# Patient Record
Sex: Female | Born: 1960 | Race: White | Hispanic: No | Marital: Married | Smoking: Never smoker
Health system: Southern US, Community
[De-identification: ages and names within clinical notes are randomized; demographics above are authoritative.]

## PROBLEM LIST (undated history)

## (undated) DIAGNOSIS — I10 Essential (primary) hypertension: Secondary | ICD-10-CM

## (undated) DIAGNOSIS — E7800 Pure hypercholesterolemia, unspecified: Secondary | ICD-10-CM

## (undated) DIAGNOSIS — IMO0002 Reserved for concepts with insufficient information to code with codable children: Secondary | ICD-10-CM

## (undated) DIAGNOSIS — R87629 Unspecified abnormal cytological findings in specimens from vagina: Secondary | ICD-10-CM

## (undated) DIAGNOSIS — E079 Disorder of thyroid, unspecified: Secondary | ICD-10-CM

## (undated) DIAGNOSIS — E039 Hypothyroidism, unspecified: Secondary | ICD-10-CM

## (undated) DIAGNOSIS — R232 Flushing: Secondary | ICD-10-CM

## (undated) DIAGNOSIS — Z8719 Personal history of other diseases of the digestive system: Secondary | ICD-10-CM

## (undated) DIAGNOSIS — K219 Gastro-esophageal reflux disease without esophagitis: Secondary | ICD-10-CM

## (undated) DIAGNOSIS — R87619 Unspecified abnormal cytological findings in specimens from cervix uteri: Secondary | ICD-10-CM

## (undated) HISTORY — DX: Unspecified abnormal cytological findings in specimens from cervix uteri: R87.619

## (undated) HISTORY — DX: Reserved for concepts with insufficient information to code with codable children: IMO0002

## (undated) HISTORY — DX: Unspecified abnormal cytological findings in specimens from vagina: R87.629

## (undated) HISTORY — PX: CHOLECYSTECTOMY: SHX55

## (undated) HISTORY — PX: TONSILLECTOMY: SUR1361

## (undated) HISTORY — PX: TUBAL LIGATION: SHX77

## (undated) HISTORY — DX: Flushing: R23.2

## (undated) HISTORY — PX: ENDOMETRIAL ABLATION: SHX621

## (undated) HISTORY — PX: FOOT BONE EXCISION: SUR493

---

## 2002-06-03 ENCOUNTER — Ambulatory Visit (HOSPITAL_BASED_OUTPATIENT_CLINIC_OR_DEPARTMENT_OTHER): Admission: RE | Admit: 2002-06-03 | Discharge: 2002-06-03 | Payer: Self-pay | Admitting: Obstetrics and Gynecology

## 2003-02-02 ENCOUNTER — Ambulatory Visit (HOSPITAL_COMMUNITY): Admission: RE | Admit: 2003-02-02 | Discharge: 2003-02-02 | Payer: Self-pay | Admitting: Internal Medicine

## 2003-02-02 ENCOUNTER — Encounter: Payer: Self-pay | Admitting: Internal Medicine

## 2004-04-17 ENCOUNTER — Ambulatory Visit (HOSPITAL_COMMUNITY): Admission: RE | Admit: 2004-04-17 | Discharge: 2004-04-17 | Payer: Self-pay | Admitting: Obstetrics and Gynecology

## 2004-10-04 ENCOUNTER — Emergency Department (HOSPITAL_COMMUNITY): Admission: EM | Admit: 2004-10-04 | Discharge: 2004-10-05 | Payer: Self-pay | Admitting: Emergency Medicine

## 2004-10-08 ENCOUNTER — Ambulatory Visit (HOSPITAL_COMMUNITY): Admission: RE | Admit: 2004-10-08 | Discharge: 2004-10-08 | Payer: Self-pay | Admitting: Internal Medicine

## 2005-04-11 ENCOUNTER — Emergency Department (HOSPITAL_COMMUNITY): Admission: EM | Admit: 2005-04-11 | Discharge: 2005-04-11 | Payer: Self-pay | Admitting: Emergency Medicine

## 2005-04-23 ENCOUNTER — Ambulatory Visit (HOSPITAL_COMMUNITY): Admission: RE | Admit: 2005-04-23 | Discharge: 2005-04-23 | Payer: Self-pay | Admitting: Internal Medicine

## 2007-01-29 ENCOUNTER — Ambulatory Visit (HOSPITAL_COMMUNITY): Admission: RE | Admit: 2007-01-29 | Discharge: 2007-01-29 | Payer: Self-pay | Admitting: Obstetrics and Gynecology

## 2007-01-29 ENCOUNTER — Encounter (INDEPENDENT_AMBULATORY_CARE_PROVIDER_SITE_OTHER): Payer: Self-pay | Admitting: *Deleted

## 2009-01-10 ENCOUNTER — Other Ambulatory Visit: Admission: RE | Admit: 2009-01-10 | Discharge: 2009-01-10 | Payer: Self-pay | Admitting: Obstetrics & Gynecology

## 2009-02-22 ENCOUNTER — Emergency Department (HOSPITAL_COMMUNITY): Admission: EM | Admit: 2009-02-22 | Discharge: 2009-02-22 | Payer: Self-pay | Admitting: Emergency Medicine

## 2009-12-13 ENCOUNTER — Ambulatory Visit (HOSPITAL_COMMUNITY): Admission: RE | Admit: 2009-12-13 | Discharge: 2009-12-13 | Payer: Self-pay | Admitting: Obstetrics and Gynecology

## 2009-12-21 ENCOUNTER — Encounter: Admission: RE | Admit: 2009-12-21 | Discharge: 2009-12-21 | Payer: Self-pay | Admitting: Obstetrics and Gynecology

## 2010-11-10 IMAGING — MG MM DIGITAL DIAGNOSTIC UNILAT L
1 series · 1 of 1 positions shown · non-contrast
Comparison: April 17, 2004

CLINICAL DATA: Lump described in the upper left breast at the time
of recent screening mammogram

DIGITAL DIAGNOSTIC LEFT MAMMOGRAM

[L TAN]
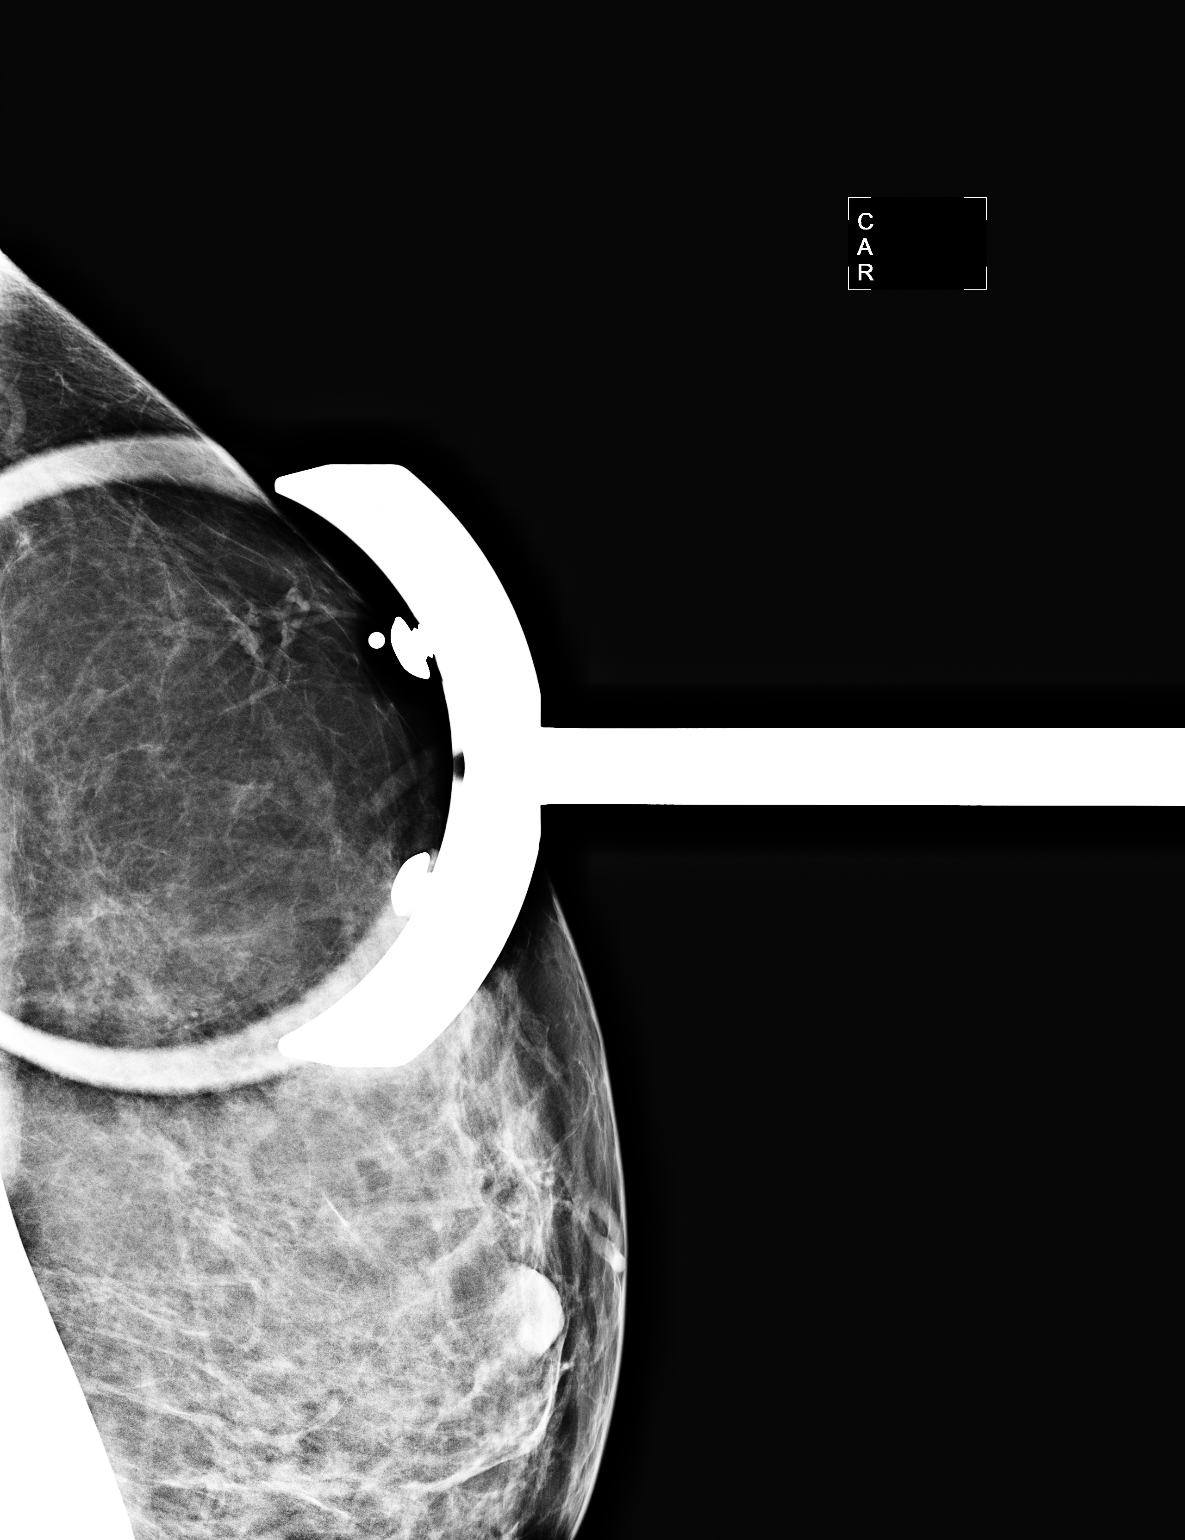

[1 of 1 positions shown; findings below may reference images not displayed]

FINDINGS: Additional view reveals no mass or distortion within the
upper left breast.  Fatty tissue only is seen in this location.
The patient can no longer feel a lump.  Physical exam of the left
breast is normal today.
IMPRESSION: There is no radiographic evidence of malignancy on the left.
Screening mammogram in 1 year recommended.

BI-RADS CATEGORY 1:  Negative.

## 2011-01-06 ENCOUNTER — Encounter: Payer: Self-pay | Admitting: Internal Medicine

## 2011-01-06 ENCOUNTER — Encounter: Payer: Self-pay | Admitting: Obstetrics and Gynecology

## 2011-05-03 NOTE — Op Note (Signed)
Erica Fry, Erica Fry               ACCOUNT NO.:  [id number]   MEDICAL RECORD NO.:  [id number]          PATIENT TYPE:  AMB   LOCATION:  DAY                           FACILITY:  [facility]   PHYSICIAN:  Tilda Burrow, M.D. DATE OF BIRTH:  [birth year]   DATE OF PROCEDURE:  01/29/2007  DATE OF DISCHARGE:                               OPERATIVE REPORT   PREOPERATIVE DIAGNOSIS:  Menorrhagia.   POSTOPERATIVE DIAGNOSIS:  Menorrhagia.   OPERATION PERFORMED:  Hysteroscopy, dilation and curettage, endometrial  ablation.   SURGEON:  Tilda Burrow, M.D.   ASSISTANTAmie Critchley, CST.   ANESTHESIA:  General.   COMPLICATIONS:  None.   FINDINGS:  Uterus sounding to 9 cm, smooth uterine internal contours,  retroverted midplane uterus.   DESCRIPTION OF PROCEDURE:  The patient was taken to the operating room,  prepped and draped for vaginal procedure.  Speculum was inserted.  Single toothed tenaculum grasped and paracervical block with 15 mL of  Marcaine with epinephrine injected.  Uterus was sounded to 9 cm and  dilated to 26 [place] allowing introduction of the 30 hysteroscope which  visualized endometrial cavity and photos 1, 2, and 3 show the  preprocedure endometrial cavity.  Dilation and curettage was then  performed and a subsequent hysteroscopic picture taken to document  excellent thorough evacuation of the debris from the endometrial cavity.  GyneCare Thermachoice endometrial ablation sequence was then performed  using 15 mL of saline under 158 mmHg pressure for 8 minutes resulting in  satisfactory thermal changes of the endometrial cavity as documented in  photo 5.  Photo 6 shows the lower uterine segment which shows some areas  that were not coagulated by the ablation process, there may be a  possibility of some residual hormonally active tissue from this lower  uterine segment.  If patient has residual menses after the procedure  rather than complete amenorrhea, this is considered  the likely source.   There were no complications.  All fluid was accounted for at the end of  the procedure.  The patient was then transferred to the recovery room in  good condition.   ADDENDUM:  During discussion with husband after the procedure, he spoke  very caringly about challenges in the relationship with the wife's  detachment, discontinuation of wearing the wedding ring, allegedly.  They have been  married 27 years.  She has within the last year stopped wearing the  wedding ring, has discussed whether to stay in the family home, etc.  Husband is very concerned and asked for guidance.  Will speak to  [facility]  in follow up to try to seek answers or suggestions.      Tilda Burrow, M.D.  Electronically Signed     JVF/MEDQ  D:  01/29/2007  T:  01/29/2007  Job:  [id number]   cc:   Kingsley Callander. Ouida Sills, MD  Fax: [phone]
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
## 2011-05-03 NOTE — Op Note (Signed)
[facility]  Patient:    Erica Fry, Erica Fry Visit Number: [id number] MRN: [id number]          Service Type: NES Location: NESC Attending Physician:  Lendon Colonel Dictated by:   Kathie Rhodes. Kyra Manges, M.D. Proc. Date: 06/03/02 Admit Date:  06/03/2002                             Operative Report  PREOPERATIVE DIAGNOSIS:  Desires sterilization.  POSTOPERATIVE DIAGNOSIS:  Desires sterilization.  OPERATION:  Laparoscopic tubal ligation.  DESCRIPTION OF PROCEDURE:  The patient was placed in the lithotomy position, prepped and draped in the usual fashion, bladder emptied.  The patient was examined and found to have an anterior normal size uterus without masses. Hulka elevator was inserted into the uterus.  A transverse incision was made in the abdomen.  The Veress needle was inserted.  Aspiration infusion was used, and the abdomen was distended with carbon dioxide.  A trocar was inserted into the abdomen.  Visualization of the pelvis revealed two normal tubes and ovaries.  A second puncture was made, and then the tubes were then cauterized 2 cm lateral to the ureterotubal junction and scissored.  Gas was evacuated.  The umbilical incision was closed with a UR-6 needle, approximating the fascia with 0 Vicryl and then 4-0 PDS for the skin.  The patient was awakened and carried to the recovery room in good condition.  The incision was infiltrated with 0.5% Marcaine with epinephrine. Dictated by:   S. Kyra Manges, M.D. Attending Physician:  Lendon Colonel DD:  06/03/02 TD:  06/04/02 Job: [id number]

## 2011-05-03 NOTE — Procedures (Signed)
Erica Fry, Erica Fry               ACCOUNT NO.:  [id number]   MEDICAL RECORD NO.:  [id number]          PATIENT TYPE:  REC   LOCATION:  RAD                           FACILITY:  [facility]   PHYSICIAN:  Kingsley Callander. Ouida Sills, MD       DATE OF BIRTH:  [birth year]   DATE OF PROCEDURE:  04/23/2005  DATE OF DISCHARGE:                                    STRESS TEST   The patient exercised 10 minutes (1 minute into stage 4 of the Bruce  protocol), obtaining a maximum heart rate of 174 (98% of the age-predicted  maximum heart rate) at a workload of 12.8 METS and discontinued exercise due  to leg fatigue. There were no symptoms of chest pain. There were no  arrhythmias. There were no ST segment changes diagnostic of ischemia. The  baseline EKG revealed normal sinus rhythm at 85 beats per minute with poor R-  wave progression.   IMPRESSION:  No evidence of exercise-induced ischemia.      ROF/MEDQ  D:  04/23/2005  T:  04/23/2005  Job:  [id number]

## 2012-01-29 ENCOUNTER — Other Ambulatory Visit: Payer: Self-pay

## 2012-01-29 ENCOUNTER — Emergency Department (HOSPITAL_COMMUNITY)
Admission: EM | Admit: 2012-01-29 | Discharge: 2012-01-29 | Disposition: A | Discharge status: Home / Self Care | Payer: BC Managed Care – PPO | Attending: Emergency Medicine | Admitting: Emergency Medicine

## 2012-01-29 ENCOUNTER — Emergency Department (HOSPITAL_COMMUNITY): Payer: BC Managed Care – PPO

## 2012-01-29 ENCOUNTER — Encounter (HOSPITAL_COMMUNITY): Payer: Self-pay | Admitting: Emergency Medicine

## 2012-01-29 DIAGNOSIS — R5381 Other malaise: Secondary | ICD-10-CM | POA: Insufficient documentation

## 2012-01-29 DIAGNOSIS — R209 Unspecified disturbances of skin sensation: Secondary | ICD-10-CM | POA: Insufficient documentation

## 2012-01-29 DIAGNOSIS — E079 Disorder of thyroid, unspecified: Secondary | ICD-10-CM | POA: Insufficient documentation

## 2012-01-29 DIAGNOSIS — R202 Paresthesia of skin: Secondary | ICD-10-CM

## 2012-01-29 DIAGNOSIS — R5383 Other fatigue: Secondary | ICD-10-CM | POA: Insufficient documentation

## 2012-01-29 DIAGNOSIS — R42 Dizziness and giddiness: Secondary | ICD-10-CM | POA: Insufficient documentation

## 2012-01-29 DIAGNOSIS — R079 Chest pain, unspecified: Secondary | ICD-10-CM | POA: Insufficient documentation

## 2012-01-29 DIAGNOSIS — I10 Essential (primary) hypertension: Secondary | ICD-10-CM | POA: Insufficient documentation

## 2012-01-29 HISTORY — DX: Disorder of thyroid, unspecified: E07.9

## 2012-01-29 HISTORY — DX: Essential (primary) hypertension: I10

## 2012-01-29 LAB — BASIC METABOLIC PANEL
BUN: 9 mg/dL (ref 6–23)
CO2: 28 mEq/L (ref 19–32)
Calcium: 10 mg/dL (ref 8.4–10.5)
Chloride: 107 mEq/L (ref 96–112)
Creatinine, Ser: 0.84 mg/dL (ref 0.50–1.10)
GFR calc Af Amer: >90 mL/min (ref >90)
GFR calc non Af Amer: 80 mL/min — ABNORMAL LOW (ref >90)
Glucose, Bld: 116 mg/dL — ABNORMAL HIGH (ref 70–99)
Potassium: 4 mEq/L (ref 3.5–5.1)
Sodium: 142 mEq/L (ref 135–145)

## 2012-01-29 LAB — CBC
HCT: 41 % (ref 36.0–46.0)
Hemoglobin: 14.4 g/dL (ref 12.0–15.0)
MCH: 30.8 pg (ref 26.0–34.0)
MCHC: 35.1 g/dL (ref 30.0–36.0)
MCV: 87.8 fL (ref 78.0–100.0)
Platelets: 223 10*3/uL (ref 150–400)
RBC: 4.67 MIL/uL (ref 3.87–5.11)
RDW: 12.7 % (ref 11.5–15.5)
WBC: 4.6 10*3/uL (ref 4.0–10.5)

## 2012-01-29 LAB — URINALYSIS, ROUTINE W REFLEX MICROSCOPIC
Glucose, UA: NEGATIVE mg/dL
Leukocytes, UA: NEGATIVE
Nitrite: NEGATIVE
pH: 6 (ref 5.0–8.0)

## 2012-01-29 LAB — D-DIMER, QUANTITATIVE: D-Dimer, Quant: 0.3 ug/mL-FEU (ref 0.00–0.48)

## 2012-01-29 LAB — TROPONIN I: Troponin I: <0.3 ng/mL (ref <0.30)

## 2012-01-29 MED ORDER — NITROGLYCERIN 0.4 MG SL SUBL
0.4000 mg | SUBLINGUAL_TABLET | Freq: Once | SUBLINGUAL | Status: AC
  Administered 2012-01-29: 0.4 mg via SUBLINGUAL
  Filled 2012-01-29: qty 25

## 2012-01-29 MED ORDER — MECLIZINE HCL 12.5 MG PO TABS
25.0000 mg | ORAL_TABLET | Freq: Once | ORAL | Status: AC
  Administered 2012-01-29: 25 mg via ORAL
  Filled 2012-01-29: qty 2

## 2012-01-29 MED ORDER — ASPIRIN 81 MG PO CHEW
324.0000 mg | CHEWABLE_TABLET | Freq: Once | ORAL | Status: AC
  Administered 2012-01-29: 324 mg via ORAL
  Filled 2012-01-29: qty 4

## 2012-01-29 NOTE — H&P (Signed)
Erica Fry MRN: [id number] DOB/AGE: 50-Sep-1962 50 y.o. Primary Care Physician:HALL,ZACK, MD, MD Consult date: 01/29/2012 Chief Complaint: Left arm and leg tingling. Left chest pain. HPI: This 51 year old lady, who has a history of hypertension, hyperlipidemia presents with the above symptoms. The left arm and leg tingling have been present intermittently for the last 2 weeks. On occasion has she had any weakness of the left arm or leg. She has normal gait. She has never experienced any speech difficulties, vision problems, word finding issues or gait problems. Her cognition has been normal. The tingling only lasts a few minutes at the most. She also describes left submammary chest pain which is sharp, relieved by position and certainly not associated with exertion. She has no cough, fever and no history of hemoptysis. Investigations in the emergency room show normal brain CT scan, no elevation in d-dimer, troponin levels in the normal range and a normal chest x-ray.  Past Medical History  Diagnosis Date  . Thyroid disease   . Hypertension    Past Surgical History  Procedure Date  . Cholecystectomy   . Tubal ligation   . Tonsillectomy            Social history: she divorced, lives alone. She does not smoke cigarettes nor does she drink alcohol excessively. She works for a tobacco company.   Allergies: No Known Allergies  Medications Prior to Admission  Medication Dose Route Frequency Provider Last Rate Last Dose  . aspirin chewable tablet 324 mg  324 mg Oral Once [name], MD   324 mg at 01/29/12 1026  . meclizine (ANTIVERT) tablet 25 mg  25 mg Oral Once [name], MD   25 mg at 01/29/12 1026  . nitroGLYCERIN (NITROSTAT) SL tablet 0.4 mg  0.4 mg Sublingual Once [name], MD   0.4 mg at 01/29/12 1028   No current outpatient prescriptions on file as of 01/29/2012.       WUJ:WJXBJ from the symptoms mentioned above,there are no other symptoms referable to all systems  reviewed.  Physical Exam: Blood pressure 115/76, pulse 72, temperature 98.3 F (36.8 C), resp. rate 21, height 5\' 5"  (1.651 m), weight 78.926 kg (174 lb), SpO2 96.00%.  she looks systemically well and is not in any acute pain. Heart sounds are present and normal without gallop rhythm. Jugular venous pressure not raised. There is no pericardial rub. She is tender in the submammary area on the left side of the chest. This reproduces her pain. Lung fields are entirely clear with no evidence of pleural rub. Neurological examination shows no abnormalities in cranial nerves, normal speech, normal cognition. She is equal and normal strength in both arms and legs. There is no sensory disturbance especially in the left arm and leg. She has no cerebellar signs. Her abdomen is soft and largely nontender, slight tenderness in left upper quadrant close to the left chest wall tenderness.     Basename 01/29/12 0949  WBC 4.6  NEUTROABS --  HGB 14.4  HCT 41.0  MCV 87.8  PLT 223    Basename 01/29/12 0949  NA 142  K 4.0  CL 107  CO2 28  GLUCOSE 116*  BUN 9  CREATININE 0.84  CALCIUM 10.0  MG --         Dg Chest 2 View  01/29/2012  *RADIOLOGY REPORT*  Clinical Data: Intermittent left-sided chest pain radiating into the back over the past week.  History of hypertension.  Weakness and numbness in the left arm  and leg.  CHEST - 2 VIEW 01/29/2012:  Comparison: Portable chest x-ray 04/11/2005 [facility].  Findings: Cardiomediastinal silhouette unremarkable.  Lungs clear. Attenuated pulmonary vessels in the left upper lobe.  Normal bronchovascular markings elsewhere.  No pleural effusions. Visualized bony thorax intact apart from slight thoracic scoliosis convex right.  IMPRESSION: No acute cardiopulmonary disease.  Attenuated pulmonary vessels in the left upper lobe, emphysema versus sequelae of prior infection.  Original Report Authenticated By: Arnell Sieving, M.D.   Ct Head Wo  Contrast  01/29/2012  *RADIOLOGY REPORT*  Clinical Data: Dizziness, near-syncope.  CT HEAD WITHOUT CONTRAST  Technique:  Contiguous axial images were obtained from the base of the skull through the vertex without contrast.  Comparison: 10/08/2004  Findings: No acute intracranial abnormality.  Specifically, no hemorrhage, hydrocephalus, mass lesion, acute infarction, or significant intracranial injury.  No acute calvarial abnormality. Visualized paranasal sinuses and mastoids clear.  Orbital soft tissues unremarkable.  IMPRESSION: Normal study.  Original Report Authenticated By: Cyndie Chime, M.D.   Impression:  1. Left arm and leg paresthesia, unclear etiology. 2. Left musculoskeletal chest pain.      Plan:  1. Patient can be discharged home. 2. She will need workup as an outpatient, probably MRI of the neck would be appropriate. The patient wishes to follow with her primary care physician first and she will call Dr. Margo Aye today.       Wilson Singer Pager [phone]  01/29/2012, 12:38 PM

## 2012-01-29 NOTE — ED Notes (Signed)
Pt c/o intermittent weakness/dizziness and left arm numbness since last night. Speech clear. Pt a and o x 4. nad noted.

## 2012-01-29 NOTE — ED Notes (Signed)
MD at bedside. 

## 2012-01-29 NOTE — ED Provider Notes (Signed)
History    This chart was scribed for Erica Booze, MD, MD by Smitty Pluck. The patient was seen in room APA14 and the patient's care was started at 9:26AM.   CSN: [id number]  Arrival date & time 01/29/12  0917   First MD Initiated Contact with Patient 01/29/12 0919      Chief Complaint  Patient presents with  . Weakness    (Consider location/radiation/quality/duration/timing/severity/associated sxs/prior treatment) The history is provided by the patient.   Erica Fry is a 51 y.o. female who presents to the Emergency Department complaining of intermittent left chest, arm and leg tingling sensation onset 7 days ago. Last night around midnight she said the pain was similar to pressure with squeezing 5/10 pain. She reports that now the pain is 2/10. The pain was alleviated while standing up.Pt reports having intermittent dizziness and feeling of syncope. She reports that "everything was moving" and nausea. She denies SOB and diaphoresis.  Pt reports that 7 days ago she was walking and "something kind of snapped in chest pain" similar "to someone thumbing you in chest." she reports that it lasted 15 minutes. Pt reports that she has been trying to take it easy to alleviate pain. She reports having HTN and hyperlipidemia. She does not smoke. She takes 325 mg of aspirin every night.  PCP is Dr. Margo Aye.  Past Medical History  Diagnosis Date  . Thyroid disease   . Hypertension     Past Surgical History  Procedure Date  . Cholecystectomy   . Tubal ligation   . Tonsillectomy     No family history on file.  History  Substance Use Topics  . Smoking status: Never Smoker   . Smokeless tobacco: Not on file  . Alcohol Use: No    OB History    Grav Para Term Preterm Abortions TAB SAB Ect Mult Living                  Review of Systems  All other systems reviewed and are negative.   10 Systems reviewed and are negative for acute change except as noted in the HPI.  Allergies    Review of patient's allergies indicates no known allergies.  Home Medications  No current outpatient prescriptions on file.  BP 151/82  Pulse 84  Temp 98.3 F (36.8 C)  Resp 20  Ht 5\' 5"  (1.651 m)  Wt 174 lb (78.926 kg)  BMI 28.96 kg/m2  SpO2 100%  Physical Exam  Nursing note and vitals reviewed. Constitutional: She is oriented to person, place, and time. She appears well-developed and well-nourished. No distress.  HENT:  Head: Normocephalic and atraumatic.  Eyes: Conjunctivae and EOM are normal. Pupils are equal, round, and reactive to light.  Neck: Normal range of motion. Neck supple. No tracheal deviation present.  Cardiovascular: Normal rate.   Pulmonary/Chest: Effort normal. No respiratory distress.       Tenderness in Left inframammary are with reproducible pain    Abdominal: Soft. She exhibits no distension.  Musculoskeletal: Normal range of motion.  Neurological: She is alert and oriented to person, place, and time.       No nystagmus No dizziness with head movement    Skin: Skin is warm and dry.  Psychiatric: She has a normal mood and affect. Her behavior is normal.    ED Course  Procedures (including critical care time) DIAGNOSTIC STUDIES: Oxygen Saturation is 100% on room air, normal by my interpretation.    COORDINATION OF CARE:  9:37AM EDP ordered medication: aspirin tablet 324 mg, nitrostat 0.4 mg, Antivert 25 mg   Results for orders placed during the hospital encounter of 01/29/12  CBC      Component Value Range   WBC 4.6  4.0 - 10.5 (K/uL)   RBC 4.67  3.87 - 5.11 (MIL/uL)   Hemoglobin 14.4  12.0 - 15.0 (g/dL)   HCT 16.1  09.6 - 04.5 (%)   MCV 87.8  78.0 - 100.0 (fL)   MCH 30.8  26.0 - 34.0 (pg)   MCHC 35.1  30.0 - 36.0 (g/dL)   RDW 40.9  81.1 - 91.4 (%)   Platelets 223  150 - 400 (K/uL)  BASIC METABOLIC PANEL      Component Value Range   Sodium 142  135 - 145 (mEq/L)   Potassium 4.0  3.5 - 5.1 (mEq/L)   Chloride 107  96 - 112 (mEq/L)   CO2  28  19 - 32 (mEq/L)   Glucose, Bld 116 (*) 70 - 99 (mg/dL)   BUN 9  6 - 23 (mg/dL)   Creatinine, Ser 7.82  0.50 - 1.10 (mg/dL)   Calcium 95.6  8.4 - 10.5 (mg/dL)   GFR calc non Af Amer 80 (*) >90 (mL/min)   GFR calc Af Amer >90  >90 (mL/min)  TROPONIN I      Component Value Range   Troponin I <0.30  <0.30 (ng/mL)  D-DIMER, QUANTITATIVE      Component Value Range   D-Dimer, Quant 0.30  0.00 - 0.48 (ug/mL-FEU)  URINALYSIS, ROUTINE W REFLEX MICROSCOPIC      Component Value Range   Color, Urine YELLOW  YELLOW    APPearance CLEAR  CLEAR    Specific Gravity, Urine <1.005 (*) 1.005 - 1.030    pH 6.0  5.0 - 8.0    Glucose, UA NEGATIVE  NEGATIVE (mg/dL)   Hgb urine dipstick NEGATIVE  NEGATIVE    Bilirubin Urine NEGATIVE  NEGATIVE    Ketones, ur NEGATIVE  NEGATIVE (mg/dL)   Protein, ur NEGATIVE  NEGATIVE (mg/dL)   Urobilinogen, UA 0.2  0.0 - 1.0 (mg/dL)   Nitrite NEGATIVE  NEGATIVE    Leukocytes, UA NEGATIVE  NEGATIVE    Dg Chest 2 View  01/29/2012  *RADIOLOGY REPORT*  Clinical Data: Intermittent left-sided chest pain radiating into the back over the past week.  History of hypertension.  Weakness and numbness in the left arm and leg.  CHEST - 2 VIEW 01/29/2012:  Comparison: Portable chest x-ray 04/11/2005 [facility].  Findings: Cardiomediastinal silhouette unremarkable.  Lungs clear. Attenuated pulmonary vessels in the left upper lobe.  Normal bronchovascular markings elsewhere.  No pleural effusions. Visualized bony thorax intact apart from slight thoracic scoliosis convex right.  IMPRESSION: No acute cardiopulmonary disease.  Attenuated pulmonary vessels in the left upper lobe, emphysema versus sequelae of prior infection.  Original Report Authenticated By: Arnell Sieving, M.D.   Ct Head Wo Contrast  01/29/2012  *RADIOLOGY REPORT*  Clinical Data: Dizziness, near-syncope.  CT HEAD WITHOUT CONTRAST  Technique:  Contiguous axial images were obtained from the base of the skull  through the vertex without contrast.  Comparison: 10/08/2004  Findings: No acute intracranial abnormality.  Specifically, no hemorrhage, hydrocephalus, mass lesion, acute infarction, or significant intracranial injury.  No acute calvarial abnormality. Visualized paranasal sinuses and mastoids clear.  Orbital soft tissues unremarkable.  IMPRESSION: Normal study.  Original Report Authenticated By: Cyndie Chime, M.D.     ECG shows normal  sinus rhythm with a rate of 84, no ectopy. Normal axis. Normal P wave. Normal QRS. Normal intervals. Normal ST and T waves. Impression: normal ECG. No prior ECG available for comparison.  1200: She is a complete relief of her chest pain with aspirin and nitroglycerin. Workup is unremarkable. I do suspect that her dizziness is primarily vertigo and I'm not sure if her numbness is functional or not. However, symptoms are unusual enough that I think that she should be observed with serial cardiac markers and consideration given to a TIA workup.  1210: Case is discussed with Dr. Karilyn Cota who states she will come and see the patient in the emergency department.  1. Paraesthesia of skin       MDM  Chest pain which may be cardiac in etiology. Episodes of dizziness seem most likely to be vertigo. Intermittent arm and leg numbness of uncertain cause.   I personally performed the services described in this documentation, which was scribed in my presence. The recorded information has been reviewed and considered.         Erica Booze, MD 01/30/12 [phone]

## 2012-02-26 ENCOUNTER — Other Ambulatory Visit: Payer: Self-pay | Admitting: Adult Health

## 2012-02-26 ENCOUNTER — Other Ambulatory Visit (HOSPITAL_COMMUNITY)
Admission: RE | Admit: 2012-02-26 | Discharge: 2012-02-26 | Disposition: A | Discharge status: Home / Self Care | Payer: BC Managed Care – PPO | Source: Ambulatory Visit | Attending: Obstetrics and Gynecology | Admitting: Obstetrics and Gynecology

## 2012-02-26 DIAGNOSIS — Z01419 Encounter for gynecological examination (general) (routine) without abnormal findings: Secondary | ICD-10-CM | POA: Insufficient documentation

## 2012-02-26 DIAGNOSIS — Z139 Encounter for screening, unspecified: Secondary | ICD-10-CM

## 2012-02-26 DIAGNOSIS — R87810 Cervical high risk human papillomavirus (HPV) DNA test positive: Secondary | ICD-10-CM | POA: Insufficient documentation

## 2012-02-28 ENCOUNTER — Ambulatory Visit (HOSPITAL_COMMUNITY): Payer: BC Managed Care – PPO

## 2012-03-02 ENCOUNTER — Ambulatory Visit (HOSPITAL_COMMUNITY)
Admission: RE | Admit: 2012-03-02 | Discharge: 2012-03-02 | Disposition: A | Discharge status: Home / Self Care | Payer: BC Managed Care – PPO | Source: Ambulatory Visit | Attending: Adult Health | Admitting: Adult Health

## 2012-03-02 DIAGNOSIS — Z1231 Encounter for screening mammogram for malignant neoplasm of breast: Secondary | ICD-10-CM | POA: Insufficient documentation

## 2012-03-02 DIAGNOSIS — Z139 Encounter for screening, unspecified: Secondary | ICD-10-CM

## 2012-03-11 ENCOUNTER — Encounter (INDEPENDENT_AMBULATORY_CARE_PROVIDER_SITE_OTHER): Payer: Self-pay | Admitting: *Deleted

## 2012-03-26 ENCOUNTER — Other Ambulatory Visit (INDEPENDENT_AMBULATORY_CARE_PROVIDER_SITE_OTHER): Payer: Self-pay | Admitting: *Deleted

## 2012-03-26 ENCOUNTER — Encounter (INDEPENDENT_AMBULATORY_CARE_PROVIDER_SITE_OTHER): Payer: Self-pay | Admitting: *Deleted

## 2012-03-26 ENCOUNTER — Telehealth (INDEPENDENT_AMBULATORY_CARE_PROVIDER_SITE_OTHER): Payer: Self-pay | Admitting: *Deleted

## 2012-03-26 DIAGNOSIS — Z800 Family history of malignant neoplasm of digestive organs: Secondary | ICD-10-CM

## 2012-03-26 DIAGNOSIS — Z1211 Encounter for screening for malignant neoplasm of colon: Secondary | ICD-10-CM

## 2012-03-26 NOTE — Telephone Encounter (Signed)
Patient needs movi prep 

## 2012-03-27 MED ORDER — PEG-KCL-NACL-NASULF-NA ASC-C 100 G PO SOLR: 1.0000 | Freq: Once | ORAL | Status: DC

## 2012-04-29 ENCOUNTER — Telehealth (INDEPENDENT_AMBULATORY_CARE_PROVIDER_SITE_OTHER): Payer: Self-pay | Admitting: *Deleted

## 2012-04-29 NOTE — Telephone Encounter (Signed)
Requesting MD: Cyril Mourning [facility]) PCP: Dwana Melena  Name & DOB: Erica Fry 2061/01/20      Procedure: tcs  Reason/Indication:  Screening, fh crc  Has patient had this procedure before?  no  If so, when, by whom and where?    Is there a family history of colon cancer?  yes  Who?  What age when diagnosed?  uncle  Is patient diabetic?   no      Does patient have prosthetic heart valve?  no  Do you have a pacemaker?  no  Has patient had joint replacement within last 12 months?  no  Is patient on Coumadin, Plavix and/or Aspirin? yes  Medications: asa 325 mg daily, losartan 100 mg daily, synthroid 125 mcg daily, simcor 1000/40 mg daily, combi patch 50/140 mg 2 patches per week  Allergies: nkda  Medication Adjustment: asa 2 days  Procedure date & time: 05/20/12 @ 830

## 2012-05-05 NOTE — Telephone Encounter (Signed)
agree

## 2012-05-14 ENCOUNTER — Encounter (HOSPITAL_COMMUNITY): Payer: Self-pay | Admitting: Pharmacy Technician

## 2012-05-20 ENCOUNTER — Encounter (HOSPITAL_COMMUNITY)
Admission: RE | Disposition: A | Discharge status: Home Health | Payer: Self-pay | Source: Ambulatory Visit | Attending: Internal Medicine

## 2012-05-20 ENCOUNTER — Ambulatory Visit (HOSPITAL_COMMUNITY)
Admission: RE | Admit: 2012-05-20 | Discharge: 2012-05-20 | Disposition: A | Discharge status: Home Health | Payer: BC Managed Care – PPO | Source: Ambulatory Visit | Attending: Internal Medicine | Admitting: Internal Medicine

## 2012-05-20 ENCOUNTER — Encounter (HOSPITAL_COMMUNITY): Payer: Self-pay

## 2012-05-20 DIAGNOSIS — K648 Other hemorrhoids: Secondary | ICD-10-CM

## 2012-05-20 DIAGNOSIS — Z86010 Personal history of colonic polyps: Secondary | ICD-10-CM

## 2012-05-20 DIAGNOSIS — K5730 Diverticulosis of large intestine without perforation or abscess without bleeding: Secondary | ICD-10-CM | POA: Insufficient documentation

## 2012-05-20 DIAGNOSIS — Z1211 Encounter for screening for malignant neoplasm of colon: Secondary | ICD-10-CM

## 2012-05-20 DIAGNOSIS — Z79899 Other long term (current) drug therapy: Secondary | ICD-10-CM | POA: Insufficient documentation

## 2012-05-20 DIAGNOSIS — Z800 Family history of malignant neoplasm of digestive organs: Secondary | ICD-10-CM

## 2012-05-20 DIAGNOSIS — I10 Essential (primary) hypertension: Secondary | ICD-10-CM | POA: Insufficient documentation

## 2012-05-20 DIAGNOSIS — K644 Residual hemorrhoidal skin tags: Secondary | ICD-10-CM | POA: Insufficient documentation

## 2012-05-20 HISTORY — DX: Pure hypercholesterolemia, unspecified: E78.00

## 2012-05-20 HISTORY — DX: Personal history of other diseases of the digestive system: Z87.19

## 2012-05-20 HISTORY — PX: COLONOSCOPY: SHX5424

## 2012-05-20 SURGERY — COLONOSCOPY
Anesthesia: Moderate Sedation

## 2012-05-20 MED ORDER — STERILE WATER FOR IRRIGATION IR SOLN
Status: DC | PRN
  Administered 2012-05-20: 09:00:00

## 2012-05-20 MED ORDER — MEPERIDINE HCL 50 MG/ML IJ SOLN
INTRAMUSCULAR | Status: AC
  Filled 2012-05-20: qty 1

## 2012-05-20 MED ORDER — MIDAZOLAM HCL 5 MG/5ML IJ SOLN
INTRAMUSCULAR | Status: DC | PRN
  Administered 2012-05-20: 2 mg via INTRAVENOUS
  Administered 2012-05-20 (×2): 1 mg via INTRAVENOUS
  Administered 2012-05-20: 2 mg via INTRAVENOUS

## 2012-05-20 MED ORDER — SODIUM CHLORIDE 0.45 % IV SOLN
Freq: Once | INTRAVENOUS | Status: AC
  Administered 2012-05-20: 08:00:00 via INTRAVENOUS

## 2012-05-20 MED ORDER — MIDAZOLAM HCL 5 MG/5ML IJ SOLN
INTRAMUSCULAR | Status: AC
  Filled 2012-05-20: qty 10

## 2012-05-20 MED ORDER — MEPERIDINE HCL 50 MG/ML IJ SOLN
INTRAMUSCULAR | Status: DC | PRN
  Administered 2012-05-20 (×2): 25 mg via INTRAVENOUS

## 2012-05-20 NOTE — Op Note (Signed)
COLONOSCOPY PROCEDURE REPORT  PATIENT:  Erica Fry  MR#:  [id number] Birthdate:  1961-04-24, 51 y.o., female Endoscopist:  Dr. Malissa Hippo, MD Referred By:  Dr. Catalina Pizza, MD Procedure Date: 05/20/2012  Procedure:   Colonoscopy  Indications:  Patient is 51 year old Caucasian female who is undergoing screening colonoscopy. Family history is positive for colon carcinoma and one uncle in her brother and father has had colonic polyps.  Informed Consent:  The procedure and risks were reviewed with the patient and informed consent was obtained.  Medications:  Demerol 50 mg IV Versed 6 mg IV  Description of procedure:  After a digital rectal exam was performed, that colonoscope was advanced from the anus through the rectum and colon to the area of the cecum, ileocecal valve and appendiceal orifice. The cecum was deeply intubated. These structures were well-seen and photographed for the record. From the level of the cecum and ileocecal valve, the scope was slowly and cautiously withdrawn. The mucosal surfaces were carefully surveyed utilizing scope tip to flexion to facilitate fold flattening as needed. The scope was pulled down into the rectum where a thorough exam including retroflexion was performed. Terminal ileum was also examined.  Findings:   Prep excellent. Normal terminal ileum. Single small diverticulum at sigmoid colon. Single hemorrhoid proximal to the dentate line.  Therapeutic/Diagnostic Maneuvers Performed:  None  Complications:  None  Cecal Withdrawal Time:  9 minutes  Impression:  Normal terminal ileum. Normal colonoscopy except single small diverticulum and internal hemorrhoid.  Recommendations:  Standard instructions given. Next colonoscopy in 10 years unless family history changes.  Erica Fry  05/20/2012 8:52 AM  CC: Dr. Dwana Melena, MD, MD & Dr. Bonnetta Barry ref. provider found

## 2012-05-20 NOTE — H&P (Signed)
Erica Fry is an 51 y.o. female.   Chief Complaint: Patient is here for colonoscopy. HPI: Patient is 51 year old Caucasian female who is in for screening colonoscopy. This is patient's first exam. She denies abdominal pain change in her bowel habits or rectal bleeding. Family history significant for colonic polyps and brother and father. Family history is also significant for colon carcinoma in 1 uncle in his 62s. Past Medical History  Diagnosis Date  . Thyroid disease   . Hypertension   . PONV (postoperative nausea and vomiting)   . Hypercholesteremia   . H/O hiatal hernia     Past Surgical History  Procedure Date  . Cholecystectomy   . Tubal ligation   . Tonsillectomy   . Endometrial ablation   . Foot bone excision     right    No family history on file. Social History:  reports that she has never smoked. She does not have any smokeless tobacco history on file. She reports that she does not drink alcohol or use illicit drugs.  Allergies: No Known Allergies  Medications Prior to Admission  Medication Sig Dispense Refill  . aspirin 325 MG EC tablet Take 325 mg by mouth daily.      Marland Kitchen estradiol-norethindrone (COMBIPATCH) 0.05-0.14 MG/DAY Place 1 patch onto the skin 2 (two) times a week.      . levothyroxine (SYNTHROID) 125 MCG tablet Take 125 mcg by mouth daily. Patient takes name brand      . losartan (COZAAR) 100 MG tablet Take 100 mg by mouth daily.      . Niacin-Simvastatin [facility]) 1000-40 MG TB24 Take 1 tablet by mouth at bedtime.       . peg 3350 powder (MOVIPREP) SOLR Take 1 kit (100 g total) by mouth once.  1 kit  0    No results found for this or any previous visit (from the past 48 hour(s)). No results found.  ROS  Blood pressure 129/83, pulse 72, temperature 97.8 F (36.6 C), temperature source Oral, resp. rate 16, height 5\' 5"  (1.651 m), weight 170 lb (77.111 kg), SpO2 100.00%. Physical Exam  Constitutional: She appears well-developed and well-nourished.    HENT:  Mouth/Throat: Oropharynx is clear and moist.  Eyes: Conjunctivae are normal. No scleral icterus.  Neck: No thyromegaly present.  Cardiovascular: Normal rate, regular rhythm and normal heart sounds.   No murmur heard. Respiratory: Effort normal and breath sounds normal.  GI: Soft. She exhibits no distension and no mass. There is no tenderness.  Musculoskeletal: She exhibits no edema.  Lymphadenopathy:    She has no cervical adenopathy.  Neurological: She is alert.  Skin: Skin is warm and dry.     Assessment/Plan screening colonoscopy. History of colonic polyps in one parent and  brother   Erica Fry 05/20/2012, 8:18 AM   and

## 2012-05-20 NOTE — Discharge Instructions (Signed)
Resume usual medications and diet. No driving for 24 hours. Next colonoscopy in 10 years as family history changes.  Colonoscopy Care After These instructions give you information on caring for yourself after your procedure. Your doctor may also give you more specific instructions. Call your doctor if you have any problems or questions after your procedure. HOME CARE  Take it easy for the next 24 hours.   Rest.   Walk or use warm packs on your belly (abdomen) if you have belly cramping or gas.   Do not drive for 24 hours.   You may shower.   Do not sign important papers or use machinery for 24 hours.   Drink enough fluids to keep your pee (urine) clear or pale yellow.   Resume your normal diet. Avoid heavy or fried foods.   Avoid alcohol.   Continue taking your normal medicines.   Only take medicine as told by your doctor. Do not take aspirin.  If you had growths (polyps) removed:  Do not take aspirin.   Do not drink alcohol for 7 days or as told by your doctor.   Eat a soft diet for 24 hours.  GET HELP RIGHT AWAY IF:  You have a fever.   You pass clumps of tissue (blood clots) or fill the toilet with blood.   You have belly pain that gets worse and medicine does not help.   Your belly is puffy (swollen).   You feel sick to your stomach (nauseous) or throw up (vomit).  MAKE SURE YOU:  Understand these instructions.   Will watch your condition.   Will get help right away if you are not doing well or get worse.  Document Released: 01/04/2011 Document Revised: 11/21/2011 Document Reviewed: 01/04/2011 [facility] Patient Information 2012 [place], [place].  Diverticulosis Diverticulosis is a common condition that develops when small pouches (diverticula) form in the wall of the colon. The risk of diverticulosis increases with age. It happens more often in people who eat a low-fiber diet. Most individuals with diverticulosis have no symptoms. Those individuals with  symptoms usually experience abdominal pain, constipation, or loose stools (diarrhea). HOME CARE INSTRUCTIONS   Increase the amount of fiber in your diet as directed by your caregiver or dietician. This may reduce symptoms of diverticulosis.   Your caregiver may recommend taking a dietary fiber supplement.   Drink at least 6 to 8 glasses of water each day to prevent constipation.   Try not to strain when you have a bowel movement.   Your caregiver may recommend avoiding nuts and seeds to prevent complications, although this is still an uncertain benefit.   Only take over-the-counter or prescription medicines for pain, discomfort, or fever as directed by your caregiver.  FOODS WITH HIGH FIBER CONTENT INCLUDE:  Fruits. Apple, peach, pear, tangerine, raisins, prunes.   Vegetables. Brussels sprouts, asparagus, broccoli, cabbage, carrot, cauliflower, romaine lettuce, spinach, summer squash, tomato, winter squash, zucchini.   Starchy Vegetables. Baked beans, kidney beans, lima beans, split peas, lentils, potatoes (with skin).   Grains. Whole wheat bread, brown rice, bran flake cereal, plain oatmeal, white rice, shredded wheat, bran muffins.  SEEK IMMEDIATE MEDICAL CARE IF:   You develop increasing pain or severe bloating.   You have an oral temperature above 102 F (38.9 C), not controlled by medicine.   You develop vomiting or bowel movements that are bloody or black.  Document Released: 08/29/2004 Document Revised: 11/21/2011 Document Reviewed: 05/02/2010 [facility] Patient Information 2012 [place], [place].

## 2012-05-21 ENCOUNTER — Encounter (HOSPITAL_COMMUNITY): Payer: Self-pay | Admitting: Internal Medicine

## 2013-08-24 ENCOUNTER — Other Ambulatory Visit (HOSPITAL_COMMUNITY)
Admission: RE | Admit: 2013-08-24 | Discharge: 2013-08-24 | Disposition: A | Discharge status: Home / Self Care | Payer: BC Managed Care – PPO | Source: Ambulatory Visit | Attending: Adult Health | Admitting: Adult Health

## 2013-08-24 ENCOUNTER — Encounter: Payer: Self-pay | Admitting: Adult Health

## 2013-08-24 ENCOUNTER — Ambulatory Visit (INDEPENDENT_AMBULATORY_CARE_PROVIDER_SITE_OTHER): Payer: BC Managed Care – PPO | Admitting: Adult Health

## 2013-08-24 VITALS — BP 150/100 | HR 74 | Ht 64.0 in | Wt 171.0 lb

## 2013-08-24 DIAGNOSIS — Z1212 Encounter for screening for malignant neoplasm of rectum: Secondary | ICD-10-CM

## 2013-08-24 DIAGNOSIS — Z01419 Encounter for gynecological examination (general) (routine) without abnormal findings: Secondary | ICD-10-CM | POA: Insufficient documentation

## 2013-08-24 DIAGNOSIS — Z87898 Personal history of other specified conditions: Secondary | ICD-10-CM

## 2013-08-24 DIAGNOSIS — Z1151 Encounter for screening for human papillomavirus (HPV): Secondary | ICD-10-CM | POA: Insufficient documentation

## 2013-08-24 DIAGNOSIS — IMO0002 Reserved for concepts with insufficient information to code with codable children: Secondary | ICD-10-CM

## 2013-08-24 DIAGNOSIS — I10 Essential (primary) hypertension: Secondary | ICD-10-CM

## 2013-08-24 HISTORY — DX: Reserved for concepts with insufficient information to code with codable children: IMO0002

## 2013-08-24 LAB — HEMOCCULT GUIAC POC 1CARD (OFFICE): Fecal Occult Blood, POC: NEGATIVE

## 2013-08-24 NOTE — Progress Notes (Signed)
Patient ID: Erica Fry, female   DOB: Mar 22, 1961, 52 y.o.   MRN: [id number] History of Present Illness: Erica Fry is a 52 year old white female married in for pap and physical. Had colonoscopy last year.  Current Medications, Allergies, Past Medical History, Past Surgical History, Family History and Social History were reviewed in [name] record.     Review of Systems: Patient denies any headaches, blurred vision, shortness of breath, chest pain, except like once a year she has pain and it hurts in arm too, she sees Dr Frankey Shown abdominal pain, problems with bowel movements, or intercourse. No joint pain or mood swings, she has some stress incontinence at times. Some hot flashes.had abnormal pap last year with negative colpo.   Physical Exam:BP 150/100  Pulse 74  Ht 5\' 4"  (1.626 m)  Wt 171 lb (77.565 kg)  BMI 29.34 kg/m2BP recheck 140/98 in right arm. General:  Well developed, well nourished, no acute distress Skin:  Warm and dry Neck:  Midline trachea, normal thyroid Lungs; Clear to auscultation bilaterally Breast:  No dominant palpable mass, retraction, or nipple discharge Cardiovascular: Regular rate and rhythm Abdomen:  Soft, non tender, no hepatosplenomegaly Pelvic:  External genitalia is normal in appearance.  The vagina is normal in appearance.Grade II cystocele.  The cervix is bulbous.Pap with HPV.  Uterus is felt to be normal size, shape, and contour.  No adnexal masses or tenderness noted. Rectal: Good sphincter tone, no polyps, or hemorrhoids felt.  Hemoccult negative. Extremities:  No swelling or varicosities noted Psych:Alert and cooperative seems happy   Impression: Yearly exam Cystocele History of abnormal pap Hypertension    Plan: Physical in 1 year Mammogram yearly  Labs with PCP Review handout on cystocele and pelvic organ prolapse Get flu shot and discussed shingles vaccine.

## 2013-08-24 NOTE — Patient Instructions (Addendum)
Physical in 1 year Mammogram yearly Labs with PCP Flu shot recommended Cystocele Repair A cystocele is a bulging, drooping hernia or break (rupture) of bladder tissue into the birth canal (vagina). This bulging or rupture occurs on the top front wall of the vagina. CAUSES  Cystocele is associated with weakness of the top front wall of the vagina due to stretching and tearing of the ligaments and muscles in the area. This is often the result of:  Multiple childbirths.    Continuous heavy lifting.  Chronic cough from asthma, emphysema, or smoking.  Being overweight.  Changes from aging.  Previous surgery in the vaginal area.  Menopause with loss of estrogen hormone and weakening of the ligaments and muscles around the bladder. SYMPTOMS   Uncontrolled loss of urine (incontinence) with cough, sneeze, or exercise.  Pelvic pressure.  Frequency or urgency to urinate because of inability to completely empty the bladder.  Bladder infections.  Needing to push on the upper vagina to help yourself pass urine. DIAGNOSIS  A cystocele can be diagnosed by doing a pelvic exam and observing the top of the vagina drooping or bulging into or out of the vagina. TREATMENT  Surgical options:  Cystocele repair is surgery that removes the hernia.  There are also different "sling" operations that may be used. Discuss the different types of surgeries to repair a cystocele with your caregiver. Your caregiver will decide what type of surgery will be best in your case. Nonsurgical options:  Kegel exercises. This helps strengthen and tighten the muscles and tissue in and around the bladder and vagina. This may help with mild cases of cystocele.  A pessary may help the cystocele. A pessary is a plastic or rubber device that lifts the bladder into place. A pessary must be fitted by a doctor.  Tampons or diaphragms that lift the bladder into place are sometimes helpful with a minor or small  cystocele.  Estrogen may help with mild cases in menopausal and aging women. LET YOUR CAREGIVER KNOW ABOUT:   Allergies to food or medicine.  Medicines taken, including vitamins, herbs, eyedrops, over-the-counter medicines, and creams.  Use of steroids (by mouth or creams).  Previous problems with anesthetics or numbing medicines.  History of bleeding problems or blood clots.  Previous surgery.  Other health problems, including diabetes and kidney problems.  Possibility of pregnancy, if this applies. RISKS AND COMPLICATIONS  All surgery is associated with risks.  There are risks with a general anesthesia. You should discuss this with your caregiver.  With spinal or epidural anesthesia, there may be an area that is not numbed, and you could feel pain.  Headache could occur with a spinal or epidural anesthetic.  The catheter you will have after surgery may not work properly or may get blocked and need to be replaced.  Excessive bleeding.  Infection.  Injury to surrounding structures.  Recurrence of the cystocele.  Surgery may not get rid of your symptoms. BEFORE THE PROCEDURE   Do not take aspirin or blood thinners for 1 week prior to surgery, unless instructed otherwise.  Do not eat or drink anything after midnight the night before surgery.  Let your caregiver know if you develop a cold or other infectious problems prior to surgery.  If being admitted the day of surgery, you should be present 1 hour prior to your procedure or as directed by your caregiver.  Plan and arrange for help when you go home from the hospital.  If you smoke,  do not smoke for at least 2 weeks before the surgery.  Do not drink any alcohol for 3 days before the surgery. PROCEDURE  You will be given an anesthetic to prevent you from feeling pain during surgery. This may be a general anesthetic that puts you to sleep, or a spinal or epidural anesthetic. You will be asleep or be numbed through  the entire procedure. During cystocele repair, tissue is pulled from the sides and around the top of the vagina to lift up the hernia. This removes the hernia so that the top of the vagina does not fall into the opening of the vagina. AFTER THE PROCEDURE  After surgery, you will be taken to the recovery room where a nurse will take care of you, checking your breathing, blood pressure, pulse, and your progress. When your caregiver feels you are stable, you will be taken to your room. You will have a drainage tube (Foley catheter) that will drain your bladder for 2 to 7 days or longer, until your bladder is working properly. This catheter is placed prior to surgery to help keep your bladder empty and out of the way during the procedure. After surgery, this will make passing your urine easier. The catheter will be removed when you can easily pass urine without this assistance. You may have gauze packing in the vagina that will be removed 1 to 2 days after the surgery. Usually, you will be given a medicine (antibiotic) that kills germs. You will be given pain medicine as needed. You can usually go home in 3 to 5 days. HOME CARE INSTRUCTIONS   Do not take baths. Take showers until your caregiver informs you otherwise.  Take antibiotics as directed by your caregiver.  Exercise as instructed. Do not perform exercises which increase the pressure inside your belly (abdomen), such as sit-ups or lifting weights, until your caregiver has given permission. Walking exercise is preferred.  Only take over-the-counter or prescription medicines for pain and discomfort as directed by your caregiver.  Do not drink alcohol while taking pain medicine.  Do not lift anything over 5 pounds.  Do not drive until your caregiver gives you permission.  Get plenty of rest and sleep.  Have someone help with your household chores for 1 to 2 weeks.  If you develop constipation, you may take a mild laxative with your  caregiver's permission. Eating bran foods and drinking enough water and fluids to keep your urine clear or pale yellow helps with constipation.  Do not take aspirin. It may cause bleeding.  You may resume normal diet and unstrenuous activities as directed.  Do not douche, use tampons, or engage in intercourse until your surgeon has given permission.  Change bandages (dressings) as directed.  Make and keep all your postoperative appointments. SEEK MEDICAL CARE IF:   You have abnormal vaginal discharge.  You develop a rash.  You are having a reaction to your medicine.  You develop nausea or vomiting. SEEK IMMEDIATE MEDICAL CARE IF:   You have redness, swelling, or increasing pain in the vaginal area.  You notice pus coming from the vagina.  You have a fever.  You notice a bad smell coming from the vagina.  You have increasing abdominal pain.  You have frequent urination or you notice burning during urination.  You notice blood in your urine.  You have excessive vaginal bleeding.  You cannot urinate. MAKE SURE YOU:   Understand these instructions.  Will watch your condition.  Will get  help right away if you are not doing well or get worse. Document Released: 11/29/2000 Document Revised: 02/24/2012 Document Reviewed: 03/01/2010 [facility] Patient Information 2014 [place], [place].

## 2013-08-26 ENCOUNTER — Encounter: Payer: Self-pay | Admitting: *Deleted

## 2013-08-26 DIAGNOSIS — E785 Hyperlipidemia, unspecified: Secondary | ICD-10-CM | POA: Insufficient documentation

## 2013-08-26 DIAGNOSIS — E039 Hypothyroidism, unspecified: Secondary | ICD-10-CM | POA: Insufficient documentation

## 2013-08-27 ENCOUNTER — Ambulatory Visit (INDEPENDENT_AMBULATORY_CARE_PROVIDER_SITE_OTHER): Payer: BC Managed Care – PPO | Admitting: Cardiology

## 2013-08-27 ENCOUNTER — Encounter: Payer: Self-pay | Admitting: *Deleted

## 2013-08-27 ENCOUNTER — Encounter: Payer: Self-pay | Admitting: Cardiology

## 2013-08-27 VITALS — BP 140/102 | HR 73 | Ht 65.0 in | Wt 168.0 lb

## 2013-08-27 DIAGNOSIS — R079 Chest pain, unspecified: Secondary | ICD-10-CM

## 2013-08-27 DIAGNOSIS — I10 Essential (primary) hypertension: Secondary | ICD-10-CM

## 2013-08-27 DIAGNOSIS — E785 Hyperlipidemia, unspecified: Secondary | ICD-10-CM

## 2013-08-27 MED ORDER — NITROGLYCERIN 0.4 MG SL SUBL: 0.4000 mg | SUBLINGUAL_TABLET | SUBLINGUAL | Status: DC | PRN

## 2013-08-27 NOTE — Progress Notes (Signed)
Clinical Summary Erica Fry is a 52 y.o.female referred by Dr Margo Aye for chest pain  1. Chest pain - approx 1 month ago episode in midchest w/ radiation to left and up neck. Burning feeling, 5/10. +nausea, no SOB, + palps. Lasted approx 2 hours. Occurred at home at rest. Nothing made better or worst. - few days later felt light headed, checked bp 96/64. Had a recent changed in bp meds, and reports heavy exertion in heat over the last few days. - episode chest discomfort last night while sleeping, sharp pain left chest into left arm. Lasted about 1.5 hours. No SOB, no palps. Checked bp 162/100.  2. HTN - noted some low bps at home, stopped olmesartan-hct - currently just on olmesartan, typically 110s/80s  3. HL - compliant w/ pravastatin, lipids at goal. Managed by PCP.   Past Medical History  Diagnosis Date  . Thyroid disease   . Hypertension   . PONV (postoperative nausea and vomiting)   . Hypercholesteremia   . H/O hiatal hernia   . Abnormal Pap smear   . Cystocele 08/24/2013     No Known Allergies   Current Outpatient Prescriptions  Medication Sig Dispense Refill  . aspirin 81 MG tablet Take 81 mg by mouth daily.      Marland Kitchen levothyroxine (SYNTHROID, LEVOTHROID) 88 MCG tablet Take 88 mcg by mouth daily before breakfast.      . olmesartan (BENICAR) 20 MG tablet Take 20 mg by mouth daily.      . pravastatin (PRAVACHOL) 20 MG tablet Take 20 mg by mouth daily.       No current facility-administered medications for this visit.     Past Surgical History  Procedure Laterality Date  . Cholecystectomy    . Tubal ligation    . Tonsillectomy    . Endometrial ablation    . Foot bone excision      right  . Colonoscopy  05/20/2012    Procedure: COLONOSCOPY;  Surgeon: Malissa Hippo, MD;  Location: [facility];  Service: Endoscopy;  Laterality: N/A;  830     No Known Allergies    Family History  Problem Relation Age of Onset  . Diabetes Mother   . Stroke Mother   .  Hypertension Mother   . Hypertension Father   . Cancer Father     prostate  . Cancer Brother     prostate  . Diabetes Maternal Grandmother   . Emphysema Maternal Grandfather   . Diabetes Paternal Grandmother   . Mental illness Paternal Grandfather    Mother w/ history of CHF in her late 29s  Social History Erica Fry reports that she has never smoked. She has never used smokeless tobacco. Erica Fry reports that she does not drink alcohol.   Review of Systems 12 point ROS negative other than reported in HPI  Physical Examination p 73 bp 140/102 Gen: NAD HEENT: sclera clear, pupils equal round and reactive to light CV: RRR, 2/6 systolic murmur RUSB, no JVD, no carotid bruit Pulm: CTAB Abd: soft, NT, ND Ext: no edema Neuro: A&Ox3, no focal deficits   Diagnostic Studies Pertinent labs: 07/30/13: Hgb 12.1 Hct 35.5 Plt 292 TSH 0.12 06/2013: Na 139 K 4.7 CO2 27 Cr 0.9 BUN 14 AST 15 ALT 15 TC 180 TG 187 HDL 36 LDL 107  EKG 08/27/13: sinus rate 75, normal axis, PR 150, QRS 80ms, QTc 422, non-specific ST/T changes   Assessment and Plan  1. Chest pain: story w/  enough factors to be concerned about possible angina, but not neccesarily classic for angina. She also has multiple risk factors. Will obtain stress echo for further evaluation, she also has a murmur suggestive of aortic stenosis, will obtain 2D echo  2. HTN: elevated diastolic pressure today, reports adverse reponse to recent addition of HCTZ to her regimen. Will follow bp for now clinically, she is very anxious about her heart and may have some white coat HTN. Her bp is closely followed by her pcp  3. HL: LDL at goal per most recent labs, continued management per her PCP  F/u 3 weeks.   Antoine Poche, M.D., F.A.C.C.

## 2013-08-27 NOTE — Patient Instructions (Addendum)
Your physician recommends that you schedule a follow-up appointment in: 3 WEEKS WITH DR [facility]  Your physician has requested that you have an echocardiogram. Echocardiography is a painless test that uses sound waves to create images of your heart. It provides your doctor with information about the size and shape of your heart and how well your heart's chambers and valves are working. This procedure takes approximately one hour. There are no restrictions for this procedure.  Your physician has requested that you have a stress echocardiogram. For further information please visit [email or web address] Please follow instruction sheet as given.

## 2013-09-02 ENCOUNTER — Ambulatory Visit (HOSPITAL_COMMUNITY)
Admission: RE | Admit: 2013-09-02 | Discharge: 2013-09-02 | Disposition: A | Discharge status: Home / Self Care | Payer: BC Managed Care – PPO | Source: Ambulatory Visit | Attending: Cardiology | Admitting: Cardiology

## 2013-09-02 ENCOUNTER — Encounter (HOSPITAL_COMMUNITY): Payer: Self-pay

## 2013-09-02 ENCOUNTER — Encounter: Payer: Self-pay | Admitting: Cardiology

## 2013-09-02 ENCOUNTER — Other Ambulatory Visit (HOSPITAL_COMMUNITY): Payer: BC Managed Care – PPO

## 2013-09-02 DIAGNOSIS — I10 Essential (primary) hypertension: Secondary | ICD-10-CM | POA: Insufficient documentation

## 2013-09-02 DIAGNOSIS — R079 Chest pain, unspecified: Secondary | ICD-10-CM | POA: Insufficient documentation

## 2013-09-02 DIAGNOSIS — R072 Precordial pain: Secondary | ICD-10-CM

## 2013-09-02 DIAGNOSIS — R0602 Shortness of breath: Secondary | ICD-10-CM | POA: Insufficient documentation

## 2013-09-02 NOTE — Progress Notes (Signed)
Stress Lab Nurses Notes - [name]   NIMSI MALES  09/02/2013  Reason for doing test: Chest Pain  Type of test: Regular GTX  Nurse performing test: Parke Poisson, RN  Nuclear Medicine Tech: Not Applicable  Echo Tech: Not Applicable  MD performing test: Ival Bible Joni Reining NP  Family MD: Dr. Margo Aye  Test explained and consent signed: yes  IV started: No IV started  Symptoms: Slight SOB  Treatment/Intervention: None  Reason test stopped: fatigue  After recovery IV was: NA  Patient to return to Nuc. Med at : NA  Patient discharged: Home  Patient's Condition upon discharge was: stable  Comments: During test peak BP 185/100 & HR 169. Recovery BP 143/98 & HR 84. Symptoms resolved in recovery.  Erskine Speed T  Attending note:  Bruce protocol, peak HR 169 representing 100% MPHR. Exercised to stage 4, no chest pain, achieved 10.8 METS. No clearly diagnostic ST changes or arrhythmias. Peak BP 186/99.  Negative GXT with hypertensive response.  Jonelle Sidle, M.D., F.A.C.C.

## 2013-09-02 NOTE — Progress Notes (Signed)
*  PRELIMINARY RESULTS* Echocardiogram 2D Echocardiogram has been performed.  Erica Fry 09/02/2013, 10:05 AM

## 2013-09-02 NOTE — Progress Notes (Signed)
Stress Lab Nurses Notes - [name]  Erica Fry 09/02/2013 Reason for doing test: Chest Pain Type of test: Regular GTX Nurse performing test: Parke Poisson, RN Nuclear Medicine Tech: Not Applicable Echo Tech: Not Applicable MD performing test: Ival Bible Joni Reining NP Family MD: Dr. Margo Aye Test explained and consent signed: yes IV started: No IV started Symptoms: Slight SOB Treatment/Intervention: None Reason test stopped: fatigue After recovery IV was: NA Patient to return to Nuc. Med at : NA Patient discharged: Home Patient's Condition upon discharge was: stable Comments: During test peak BP 185/100 & HR 169.  Recovery BP 143/98 & HR 84.  Symptoms resolved in recovery. Erskine Speed T

## 2013-09-06 NOTE — Progress Notes (Signed)
Please notify the patient that her exercise stress test was normal

## 2013-09-07 NOTE — Progress Notes (Signed)
Spoke to pt to advise results/instructions. Pt understood.  

## 2013-09-21 ENCOUNTER — Ambulatory Visit: Payer: BC Managed Care – PPO | Admitting: Cardiology

## 2013-09-30 ENCOUNTER — Encounter: Payer: Self-pay | Admitting: Cardiology

## 2013-09-30 ENCOUNTER — Ambulatory Visit (INDEPENDENT_AMBULATORY_CARE_PROVIDER_SITE_OTHER): Payer: BC Managed Care – PPO | Admitting: Cardiology

## 2013-09-30 VITALS — BP 125/86 | HR 84 | Ht 65.0 in | Wt 170.0 lb

## 2013-09-30 DIAGNOSIS — R079 Chest pain, unspecified: Secondary | ICD-10-CM

## 2013-09-30 NOTE — Progress Notes (Signed)
Clinical Summary Ms. Morici is a 52 y.o.female 1. Chest pain  - approx 1 month ago episode in midchest w/ radiation to left and up neck. Burning feeling, 5/10. +nausea, no SOB, + palps. Lasted approx 2 hours. Occurred at home at rest. Nothing made better or worst.  - few days later felt light headed, checked bp 96/64. Had a recent changed in bp meds, and reports heavy exertion in heat over the last few days.  - repeat episode chest discomfort while sleeping, sharp pain left chest into left arm. Lasted about 1.5 hours. No SOB, no palps. Checked bp 162/100.   - no repeat episodes since last visit.     Past Medical History  Diagnosis Date  . Thyroid disease   . Hypertension   . PONV (postoperative nausea and vomiting)   . Hypercholesteremia   . H/O hiatal hernia   . Abnormal Pap smear   . Cystocele 08/24/2013     No Known Allergies   Current Outpatient Prescriptions  Medication Sig Dispense Refill  . aspirin 81 MG tablet Take 81 mg by mouth daily.      Marland Kitchen levothyroxine (SYNTHROID, LEVOTHROID) 88 MCG tablet Take 88 mcg by mouth daily before breakfast.      . nitroGLYCERIN (NITROSTAT) 0.4 MG SL tablet Place 1 tablet (0.4 mg total) under the tongue every 5 (five) minutes as needed for chest pain.  90 tablet  3  . olmesartan (BENICAR) 20 MG tablet Take 20 mg by mouth daily.      . pravastatin (PRAVACHOL) 20 MG tablet Take 20 mg by mouth daily.       No current facility-administered medications for this visit.     Past Surgical History  Procedure Laterality Date  . Cholecystectomy    . Tubal ligation    . Tonsillectomy    . Endometrial ablation    . Foot bone excision      right  . Colonoscopy  05/20/2012    Procedure: COLONOSCOPY;  Surgeon: Malissa Hippo, MD;  Location: [facility];  Service: Endoscopy;  Laterality: N/A;  830     No Known Allergies    Family History  Problem Relation Age of Onset  . Diabetes Mother   . Stroke Mother   . Hypertension Mother     . Hypertension Father   . Cancer Father     prostate  . Cancer Brother     prostate  . Diabetes Maternal Grandmother   . Emphysema Maternal Grandfather   . Diabetes Paternal Grandmother   . Mental illness Paternal Grandfather      Social History Ms. Bump reports that she has never smoked. She has never used smokeless tobacco. Ms. Kurka reports that she does not drink alcohol.   Review of Systems CONSTITUTIONAL: No weight loss, fever, chills, weakness or fatigue.  HEENT: Eyes: No visual loss, blurred vision, double vision or yellow sclerae.No hearing loss, sneezing, congestion, runny nose or sore throat.  SKIN: No rash or itching.  CARDIOVASCULAR: per HPI RESPIRATORY: No shortness of breath, cough or sputum.  GASTROINTESTINAL: No anorexia, nausea, vomiting or diarrhea. No abdominal pain or blood.  GENITOURINARY: No burning on urination, no polyuria NEUROLOGICAL: No headache, dizziness, syncope, paralysis, ataxia, numbness or tingling in the extremities. No change in bowel or bladder control.  MUSCULOSKELETAL: No muscle, back pain, joint pain or stiffness.  LYMPHATICS: No enlarged nodes. No history of splenectomy.  PSYCHIATRIC: No history of depression or anxiety.  ENDOCRINOLOGIC: No  reports of sweating, cold or heat intolerance. No polyuria or polydipsia.  Marland Kitchen   Physical Examination p 84 bp 125/86 Wt 170 lbs BMI 28 Gen: resting comfortably, no acute distress HEENT: no scleral icterus, pupils equal round and reactive, no palptable cervical adenopathy,  CV: RRR, 2/6 systolic murmur RUSB, no JVD, no carotid bruits Resp: Clear to auscultation bilaterally GI: abdomen is soft, non-tender, non-distended, normal bowel sounds, no hepatosplenomegaly MSK: extremities are warm, no edema.  Skin: warm, no rash Neuro:  no focal deficits Psych: appropriate affect   Diagnostic Studies 09/02/13 Stress test Bruce protocol, peak HR 169 representing 100% MPHR. Exercised to stage 4, no  chest pain, achieved 10.8 METS. No clearly diagnostic ST changes or arrhythmias. Peak BP 186/99.  Negative GXT with hypertensive response.  09/02/13 Echo: LVEF 55-60%, no WMAs, grade I diastolic dysfunction   Assessment and Plan  1. Chest pain: - negative exercise stress test with good functional capacity, low risk Duke treadmill score, and no ischemia by EKG - echo shows normal LV function, mild LV dysfunction.  - do not recommend any further cardiac testing at this time - continue age appropriate risk factor modification - patient may follow up if new symptoms or as needed       Antoine Poche, M.D., F.A.C.C.

## 2013-09-30 NOTE — Patient Instructions (Signed)
Your physician recommends that you schedule a follow-up appointment in: AS NEEDED  Your physician has recommended you make the following change in your medication:   1) STOP TAKING NITROGLYCERIN

## 2014-08-25 ENCOUNTER — Encounter: Payer: Self-pay | Admitting: Adult Health

## 2014-08-25 ENCOUNTER — Ambulatory Visit (INDEPENDENT_AMBULATORY_CARE_PROVIDER_SITE_OTHER): Payer: BC Managed Care – PPO | Admitting: Adult Health

## 2014-08-25 VITALS — BP 130/90 | HR 76 | Ht 64.5 in | Wt 166.5 lb

## 2014-08-25 DIAGNOSIS — Z01419 Encounter for gynecological examination (general) (routine) without abnormal findings: Secondary | ICD-10-CM

## 2014-08-25 DIAGNOSIS — Z1212 Encounter for screening for malignant neoplasm of rectum: Secondary | ICD-10-CM

## 2014-08-25 DIAGNOSIS — IMO0002 Reserved for concepts with insufficient information to code with codable children: Secondary | ICD-10-CM

## 2014-08-25 LAB — HEMOCCULT GUIAC POC 1CARD (OFFICE): FECAL OCCULT BLD: NEGATIVE

## 2014-08-25 NOTE — Patient Instructions (Signed)
Physical in 1 year Mammogram now and yearly  Labs at PCP Get flu shot

## 2014-08-25 NOTE — Progress Notes (Signed)
Patient ID: Erica Fry, female   DOB: 09/21/1961, 53 y.o.   MRN: [id number] History of Present Illness:  Erica Fry is a 53 year old white female, married, in for physical,had normal pap with negative HPV 08/24/13. Complains of occasional hot flashes, stopped the patch.(declines meds for hot flashes at present). 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  Current Medications, Allergies, Past Medical History, Past Surgical History, Family History and Social History were reviewed in [name] record.     Review of Systems: Patient denies any headaches, blurred vision, shortness of breath, chest pain, abdominal pain, problems with bowel movements, urination, or intercourse. No mood swings, has pain at times in left heel.To try stretches and rolling frozen water bottle.    Physical Exam:BP 130/90  Pulse 76  Ht 5' 4.5" (1.638 m)  Wt 166 lb 8 oz (75.524 kg)  BMI 28.15 kg/m2 General:  Well developed, well nourished, no acute distress Skin:  Warm and dry Neck:  Midline trachea, normal thyroid Lungs; Clear to auscultation bilaterally Breast:  No dominant palpable mass, retraction, or nipple discharge Cardiovascular: Regular rate and rhythm Abdomen:  Soft, non tender, no hepatosplenomegaly Pelvic:  External genitalia is normal in appearance.  The vagina is normal in appearance. Has cystocele. The cervix is smooth.  Uterus is felt to be normal size, shape, and contour.  No  adnexal masses or tenderness noted. Rectal: Good sphincter tone, no polyps, or hemorrhoids felt.  Hemoccult negative.+ mild rectocele Extremities:  No swelling or varicosities noted Psych:  No mood changes,alert and cooperative,seems happy   Impression: Yearly gyn exam no pap Cystocele     Plan: Physical in 1 year Mammogram now and yearly(last one 2013) Had colonoscopy at 51 Labs with PCP next week Get flu shot

## 2014-10-14 ENCOUNTER — Other Ambulatory Visit: Payer: Self-pay | Admitting: Adult Health

## 2014-10-14 DIAGNOSIS — Z1231 Encounter for screening mammogram for malignant neoplasm of breast: Secondary | ICD-10-CM

## 2014-10-17 ENCOUNTER — Encounter: Payer: Self-pay | Admitting: Adult Health

## 2014-10-19 ENCOUNTER — Ambulatory Visit (HOSPITAL_COMMUNITY)
Admission: RE | Admit: 2014-10-19 | Discharge: 2014-10-19 | Disposition: A | Discharge status: Home / Self Care | Payer: BC Managed Care – PPO | Source: Ambulatory Visit | Attending: Adult Health | Admitting: Adult Health

## 2014-10-19 DIAGNOSIS — Z1231 Encounter for screening mammogram for malignant neoplasm of breast: Secondary | ICD-10-CM | POA: Diagnosis not present

## 2015-06-25 ENCOUNTER — Emergency Department (HOSPITAL_COMMUNITY)
Admission: EM | Admit: 2015-06-25 | Discharge: 2015-06-25 | Disposition: A | Discharge status: Home / Self Care | Payer: BLUE CROSS/BLUE SHIELD | Attending: Emergency Medicine | Admitting: Emergency Medicine

## 2015-06-25 ENCOUNTER — Encounter (HOSPITAL_COMMUNITY): Payer: Self-pay

## 2015-06-25 DIAGNOSIS — I10 Essential (primary) hypertension: Secondary | ICD-10-CM | POA: Insufficient documentation

## 2015-06-25 DIAGNOSIS — Z7982 Long term (current) use of aspirin: Secondary | ICD-10-CM | POA: Diagnosis not present

## 2015-06-25 DIAGNOSIS — R1012 Left upper quadrant pain: Secondary | ICD-10-CM | POA: Insufficient documentation

## 2015-06-25 DIAGNOSIS — R110 Nausea: Secondary | ICD-10-CM | POA: Insufficient documentation

## 2015-06-25 DIAGNOSIS — E780 Pure hypercholesterolemia: Secondary | ICD-10-CM | POA: Diagnosis not present

## 2015-06-25 DIAGNOSIS — Z8742 Personal history of other diseases of the female genital tract: Secondary | ICD-10-CM | POA: Diagnosis not present

## 2015-06-25 DIAGNOSIS — R197 Diarrhea, unspecified: Secondary | ICD-10-CM | POA: Insufficient documentation

## 2015-06-25 DIAGNOSIS — Z8719 Personal history of other diseases of the digestive system: Secondary | ICD-10-CM | POA: Insufficient documentation

## 2015-06-25 DIAGNOSIS — Z79899 Other long term (current) drug therapy: Secondary | ICD-10-CM | POA: Diagnosis not present

## 2015-06-25 DIAGNOSIS — R071 Chest pain on breathing: Secondary | ICD-10-CM | POA: Insufficient documentation

## 2015-06-25 DIAGNOSIS — Z9089 Acquired absence of other organs: Secondary | ICD-10-CM | POA: Diagnosis not present

## 2015-06-25 DIAGNOSIS — Z9851 Tubal ligation status: Secondary | ICD-10-CM | POA: Diagnosis not present

## 2015-06-25 DIAGNOSIS — E079 Disorder of thyroid, unspecified: Secondary | ICD-10-CM | POA: Insufficient documentation

## 2015-06-25 LAB — COMPREHENSIVE METABOLIC PANEL
ALK PHOS: 76 U/L (ref 38–126)
ALT: 18 U/L (ref 14–54)
ANION GAP: 7 (ref 5–15)
AST: 15 U/L (ref 15–41)
Albumin: 3.9 g/dL (ref 3.5–5.0)
BILIRUBIN TOTAL: 0.9 mg/dL (ref 0.3–1.2)
BUN: 14 mg/dL (ref 6–20)
CHLORIDE: 108 mmol/L (ref 101–111)
CO2: 27 mmol/L (ref 22–32)
Calcium: 9.1 mg/dL (ref 8.9–10.3)
Creatinine, Ser: 0.93 mg/dL (ref 0.44–1.00)
GFR calc non Af Amer: >60 mL/min (ref >60)
GLUCOSE: 115 mg/dL — AB (ref 65–99)
POTASSIUM: 4.1 mmol/L (ref 3.5–5.1)
SODIUM: 142 mmol/L (ref 135–145)
Total Protein: 6.1 g/dL — ABNORMAL LOW (ref 6.5–8.1)

## 2015-06-25 LAB — CBC WITH DIFFERENTIAL/PLATELET
BASOS ABS: 0 10*3/uL (ref 0.0–0.1)
Basophils Relative: 0 % (ref 0–1)
EOS ABS: 0.1 10*3/uL (ref 0.0–0.7)
EOS PCT: 3 % (ref 0–5)
HCT: 36.9 % (ref 36.0–46.0)
Hemoglobin: 12.5 g/dL (ref 12.0–15.0)
LYMPHS PCT: 28 % (ref 12–46)
Lymphs Abs: 1.4 10*3/uL (ref 0.7–4.0)
MCH: 29.8 pg (ref 26.0–34.0)
MCHC: 33.9 g/dL (ref 30.0–36.0)
MCV: 87.9 fL (ref 78.0–100.0)
Monocytes Absolute: 0.4 10*3/uL (ref 0.1–1.0)
Monocytes Relative: 9 % (ref 3–12)
NEUTROS PCT: 60 % (ref 43–77)
Neutro Abs: 3 10*3/uL (ref 1.7–7.7)
PLATELETS: 234 10*3/uL (ref 150–400)
RBC: 4.2 MIL/uL (ref 3.87–5.11)
RDW: 12 % (ref 11.5–15.5)
WBC: 4.9 10*3/uL (ref 4.0–10.5)

## 2015-06-25 LAB — URINALYSIS, ROUTINE W REFLEX MICROSCOPIC
Bilirubin Urine: NEGATIVE
Glucose, UA: NEGATIVE mg/dL
KETONES UR: NEGATIVE mg/dL
NITRITE: NEGATIVE
PH: 6 (ref 5.0–8.0)
PROTEIN: NEGATIVE mg/dL
Specific Gravity, Urine: 1.01 (ref 1.005–1.030)
Urobilinogen, UA: 0.2 mg/dL (ref 0.0–1.0)

## 2015-06-25 LAB — LIPASE, BLOOD: Lipase: 18 U/L — ABNORMAL LOW (ref 22–51)

## 2015-06-25 LAB — URINE MICROSCOPIC-ADD ON

## 2015-06-25 LAB — TROPONIN I: Troponin I: <0.03 ng/mL (ref <0.031)

## 2015-06-25 NOTE — ED Provider Notes (Signed)
CSN: [id number]     Arrival date & time 06/25/15  0401 History   First MD Initiated Contact with Patient 06/25/15 [phone]     Chief Complaint  Patient presents with  . Abdominal Pain    Patient is a 54 y.o. female presenting with abdominal pain. The history is provided by the patient.  Abdominal Pain Pain location:  LUQ Pain quality: bloating   Pain radiates to:  Does not radiate Pain severity:  Moderate Onset quality:  Gradual Duration:  3 hours Timing:  Constant Progression:  Unchanged Chronicity:  New Context: suspicious food intake   Relieved by:  None tried Worsened by:  Nothing tried Associated symptoms: belching, diarrhea and nausea   Associated symptoms: no chest pain, no dysuria, no fever, no hematochezia, no shortness of breath and no vomiting   Risk factors: no recent hospitalization   Risk factors comment:  No antibiotics recently Pt reports she ate out for dinner - she reports it was heavy/greasy food She came home and had nausea, abdominal discomfort and "got sick" She reports having a BM that was normal, but then soon after had diarrhea (nonbloody) She reports heartburn and belching   Past Medical History  Diagnosis Date  . Thyroid disease   . Hypertension   . PONV (postoperative nausea and vomiting)   . Hypercholesteremia   . H/O hiatal hernia   . Abnormal Pap smear   . Cystocele 08/24/2013  . Vaginal Pap smear, abnormal    Past Surgical History  Procedure Laterality Date  . Cholecystectomy    . Tubal ligation    . Tonsillectomy    . Endometrial ablation    . Foot bone excision      right  . Colonoscopy  05/20/2012    Procedure: COLONOSCOPY;  Surgeon: Malissa HippoNajeeb U Rehman, MD;  Location: [facility];  Service: Endoscopy;  Laterality: N/A;  830   Family History  Problem Relation Age of Onset  . Diabetes Mother   . Stroke Mother   . Hypertension Mother   . Congestive Heart Failure Mother   . Hypertension Father   . Cancer Father     prostate  . Cancer  Brother     prostate  . Diabetes Maternal Grandmother   . Stroke Maternal Grandmother   . Congestive Heart Failure Maternal Grandmother   . Emphysema Maternal Grandfather   . Diabetes Paternal Grandmother   . Hypertension Paternal Grandmother   . Stroke Paternal Grandmother   . Mental illness Paternal Grandfather     commited suicide  . Thyroid disease Daughter   . Cancer Paternal Aunt     breast  . Other Paternal Aunt     polio   History  Substance Use Topics  . Smoking status: Never Smoker   . Smokeless tobacco: Never Used  . Alcohol Use: No   OB History    Gravida Para Term Preterm AB TAB SAB Ectopic Multiple Living   2 2        2      Review of Systems  Constitutional: Negative for fever.  Respiratory: Negative for shortness of breath.   Cardiovascular: Negative for chest pain.  Gastrointestinal: Positive for nausea, abdominal pain and diarrhea. Negative for vomiting, blood in stool and hematochezia.  Genitourinary: Negative for dysuria.  All other systems reviewed and are negative.     Allergies  Review of patient's allergies indicates no known allergies.  Home Medications   Prior to Admission medications   Medication Sig Start  Date End Date Taking? Authorizing Provider  aspirin 81 MG tablet Take 81 mg by mouth daily.   Yes Historical Provider, MD  levothyroxine (SYNTHROID, LEVOTHROID) 112 MCG tablet Take 112 mcg by mouth daily before breakfast.   Yes Historical Provider, MD  olmesartan (BENICAR) 20 MG tablet Take 20 mg by mouth daily.   Yes Historical Provider, MD  pravastatin (PRAVACHOL) 20 MG tablet Take 20 mg by mouth daily.   Yes Historical Provider, MD  Probiotic Product (PROBIOTIC DAILY PO) Take by mouth daily.   Yes Historical Provider, MD   BP 160/97 mmHg  Pulse 88  Temp(Src) 98.7 F (37.1 C) (Oral)  Resp 18  Ht  (1.6 m)  Wt 173 lb (78.472 kg)  BMI 30.65 kg/m2  SpO2 100% Physical Exam CONSTITUTIONAL: Well developed/well nourished HEAD:  Normocephalic/atraumatic EYES: EOMI/PERRL ENMT: Mucous membranes moist NECK: supple no meningeal signs SPINE/BACK:entire spine nontender CV: S1/S2 noted, no murmurs/rubs/gallops noted LUNGS: Lungs are clear to auscultation bilaterally, no apparent distress Chest - mild tenderness to left lower costal margin ABDOMEN: soft, mild LUQ tenderness, no rebound or guarding, bowel sounds noted throughout abdomen GU:no cva tenderness NEURO: Pt is awake/alert/appropriate, moves all extremitiesx4.  No facial droop.   EXTREMITIES: pulses normal/equal, full ROM SKIN: warm, color normal PSYCH: no abnormalities of mood noted, alert and oriented to situation  ED Course  Procedures   6:20 AM Pt with LUQ pain/nausea/heartburn and belching She was concerned this could be cardiac related Initial EKG unremarkable She has had cardiology eval before and per cardiology records she had normal stress imaging This is a very atypical story for ACS and is more c/w GI cause particularly with diarrhea and abd pain Labs pending at this time  At time of discharge Pt improved She had no new complaints I feel she is stable for d/c home We discussed strict ER return precautions  Labs Review Labs Reviewed  COMPREHENSIVE METABOLIC PANEL - Abnormal; Notable for the following:    Glucose, Bld 115 (*)    Total Protein 6.1 (*)    All other components within normal limits  LIPASE, BLOOD - Abnormal; Notable for the following:    Lipase 18 (*)    All other components within normal limits  URINALYSIS, ROUTINE W REFLEX MICROSCOPIC (NOT AT [facility]) - Abnormal; Notable for the following:    Hgb urine dipstick SMALL (*)    Leukocytes, UA LARGE (*)    All other components within normal limits  URINE MICROSCOPIC-ADD ON - Abnormal; Notable for the following:    Squamous Epithelial / LPF FEW (*)    Bacteria, UA FEW (*)    All other components within normal limits  URINE CULTURE  CBC WITH DIFFERENTIAL/PLATELET  TROPONIN I       EKG Interpretation   Date/Time:  Sunday June 25 2015 04:55:20 EDT Ventricular Rate:  74 PR Interval:  168 QRS Duration: 85 QT Interval:  397 QTC Calculation: 440 R Axis:   44 Text Interpretation:  Sinus rhythm Probable left atrial enlargement Low  voltage, precordial leads Baseline wander in lead(s) V6 artifact noted  Otherwise no significant change Confirmed by Bebe Shaggy  MD, Dorinda Hill ([id number])  on 06/25/2015 5:08:48 AM      MDM   Final diagnoses:  Left upper quadrant pain  Diarrhea    Nursing notes including past medical history and social history reviewed and considered in documentation Labs/vital reviewed myself and considered during evaluation     Zadie Rhine, MD 06/25/15 [phone]

## 2015-06-25 NOTE — Discharge Instructions (Signed)

## 2015-06-25 NOTE — ED Notes (Signed)
Abdominal pain, diarrhea, and acid reflux per pt.

## 2015-06-25 NOTE — ED Notes (Signed)
Discharge instructions given, pt demonstrated teach back and verbal understanding. No concerns voiced.  

## 2015-06-27 LAB — URINE CULTURE

## 2015-08-28 ENCOUNTER — Other Ambulatory Visit: Payer: Self-pay | Admitting: Adult Health

## 2015-09-05 ENCOUNTER — Ambulatory Visit (INDEPENDENT_AMBULATORY_CARE_PROVIDER_SITE_OTHER): Payer: BLUE CROSS/BLUE SHIELD | Admitting: Adult Health

## 2015-09-05 ENCOUNTER — Encounter: Payer: Self-pay | Admitting: Adult Health

## 2015-09-05 VITALS — BP 130/80 | HR 76 | Ht 64.75 in | Wt 173.0 lb

## 2015-09-05 DIAGNOSIS — IMO0002 Reserved for concepts with insufficient information to code with codable children: Secondary | ICD-10-CM

## 2015-09-05 DIAGNOSIS — Z1211 Encounter for screening for malignant neoplasm of colon: Secondary | ICD-10-CM | POA: Diagnosis not present

## 2015-09-05 DIAGNOSIS — R232 Flushing: Secondary | ICD-10-CM

## 2015-09-05 DIAGNOSIS — Z01419 Encounter for gynecological examination (general) (routine) without abnormal findings: Secondary | ICD-10-CM

## 2015-09-05 HISTORY — DX: Flushing: R23.2

## 2015-09-05 LAB — HEMOCCULT GUIAC POC 1CARD (OFFICE): Fecal Occult Blood, POC: NEGATIVE

## 2015-09-05 NOTE — Progress Notes (Signed)
Patient ID: [name], female   DOB: [birth year], 54 y.o.   MRN: [id number] History of Present Illness: Shona is a 54 year old white female, married in for a well woman gyn exam,she had a normal pap with negative HPV 08/24/13.Her husband just had prostate cancer surgery.She says she can feel bladder down at times, but not out.She is having hot flashes too. PCP is [facility].  Current Medications, Allergies, Past Medical History, Past Surgical History, Family History and Social History were reviewed in [name] record.     Review of Systems: Patient denies any headaches, hearing loss, fatigue, blurred vision, shortness of breath, chest pain, abdominal pain, problems with bowel movements, urination, or intercourse(not having sex). No joint pain or mood swings.See HPI for positives. Was seen in ER in July for acid reflux, had chest pain then, worse with tomatoes.   Physical Exam:BP 130/80 mmHg  Pulse 76  Ht 5' 4.75" (1.645 m)  Wt 173 lb (78.472 kg)  BMI 29.00 kg/m2 General:  Well developed, well nourished, no acute distress Skin:  Warm and dry Neck:  Midline trachea, normal thyroid, good ROM, no lymphadenopathy Lungs; Clear to auscultation bilaterally Breast:  No dominant palpable mass, retraction, or nipple discharge Cardiovascular: Regular rate and rhythm Abdomen:  Soft, non tender, no hepatosplenomegaly Pelvic:  External genitalia is normal in appearance, no lesions.  The vagina is normal in appearance. Urethra has no lesions or masses. The cervix is bulbous.has some descensus and has cystocele.  Uterus is felt to be normal size, shape, and contour.  No adnexal masses or tenderness noted.Bladder is non tender, no masses felt. Rectal: Good sphincter tone, no polyps, or hemorrhoids felt.  Hemoccult negative. Extremities/musculoskeletal:  No swelling or varicosities noted, no clubbing or cyanosis Psych:  No mood changes, alert and cooperative,seems happy Discussed  HRT or SSRI,declines for now, also discussed pessary.  Impression:  Well woman gyn exam no pap Hot flashes Cystocele    Plan: Pap and physical in 1 year Mammogram yearly Colonoscopy per Dr Burton Apley with PCP

## 2015-09-05 NOTE — Patient Instructions (Signed)
Pap and physical in 1 year Mammogram yearly Colonoscopy per GI  

## 2016-02-01 ENCOUNTER — Other Ambulatory Visit (HOSPITAL_COMMUNITY): Payer: Self-pay | Admitting: Internal Medicine

## 2016-02-01 DIAGNOSIS — Z1231 Encounter for screening mammogram for malignant neoplasm of breast: Secondary | ICD-10-CM

## 2016-02-02 ENCOUNTER — Ambulatory Visit (HOSPITAL_COMMUNITY)
Admission: RE | Admit: 2016-02-02 | Discharge: 2016-02-02 | Disposition: A | Discharge status: Home / Self Care | Payer: BLUE CROSS/BLUE SHIELD | Source: Ambulatory Visit | Attending: Internal Medicine | Admitting: Internal Medicine

## 2016-02-02 DIAGNOSIS — Z1231 Encounter for screening mammogram for malignant neoplasm of breast: Secondary | ICD-10-CM

## 2016-03-22 DIAGNOSIS — E039 Hypothyroidism, unspecified: Secondary | ICD-10-CM | POA: Diagnosis not present

## 2016-07-25 DIAGNOSIS — E039 Hypothyroidism, unspecified: Secondary | ICD-10-CM | POA: Diagnosis not present

## 2016-07-25 DIAGNOSIS — E782 Mixed hyperlipidemia: Secondary | ICD-10-CM | POA: Diagnosis not present

## 2016-07-29 DIAGNOSIS — E039 Hypothyroidism, unspecified: Secondary | ICD-10-CM | POA: Diagnosis not present

## 2016-07-29 DIAGNOSIS — Z0000 Encounter for general adult medical examination without abnormal findings: Secondary | ICD-10-CM | POA: Diagnosis not present

## 2016-07-29 DIAGNOSIS — I10 Essential (primary) hypertension: Secondary | ICD-10-CM | POA: Diagnosis not present

## 2016-07-29 DIAGNOSIS — E782 Mixed hyperlipidemia: Secondary | ICD-10-CM | POA: Diagnosis not present

## 2016-08-06 DIAGNOSIS — R05 Cough: Secondary | ICD-10-CM | POA: Diagnosis not present

## 2016-08-06 DIAGNOSIS — J069 Acute upper respiratory infection, unspecified: Secondary | ICD-10-CM | POA: Diagnosis not present

## 2016-08-28 DIAGNOSIS — C44319 Basal cell carcinoma of skin of other parts of face: Secondary | ICD-10-CM | POA: Diagnosis not present

## 2016-08-28 DIAGNOSIS — L304 Erythema intertrigo: Secondary | ICD-10-CM | POA: Diagnosis not present

## 2016-08-28 DIAGNOSIS — Z1283 Encounter for screening for malignant neoplasm of skin: Secondary | ICD-10-CM | POA: Diagnosis not present

## 2016-09-09 ENCOUNTER — Other Ambulatory Visit: Payer: BLUE CROSS/BLUE SHIELD | Admitting: Adult Health

## 2016-09-18 ENCOUNTER — Encounter: Payer: Self-pay | Admitting: Adult Health

## 2016-09-18 ENCOUNTER — Ambulatory Visit (INDEPENDENT_AMBULATORY_CARE_PROVIDER_SITE_OTHER): Payer: BLUE CROSS/BLUE SHIELD | Admitting: Adult Health

## 2016-09-18 ENCOUNTER — Other Ambulatory Visit (HOSPITAL_COMMUNITY)
Admission: RE | Admit: 2016-09-18 | Discharge: 2016-09-18 | Disposition: A | Discharge status: Home / Self Care | Payer: BLUE CROSS/BLUE SHIELD | Source: Ambulatory Visit | Attending: Adult Health | Admitting: Adult Health

## 2016-09-18 VITALS — BP 118/60 | HR 72 | Ht 64.5 in | Wt 179.5 lb

## 2016-09-18 DIAGNOSIS — Z1212 Encounter for screening for malignant neoplasm of rectum: Secondary | ICD-10-CM | POA: Diagnosis not present

## 2016-09-18 DIAGNOSIS — Z1151 Encounter for screening for human papillomavirus (HPV): Secondary | ICD-10-CM | POA: Diagnosis not present

## 2016-09-18 DIAGNOSIS — R10814 Left lower quadrant abdominal tenderness: Secondary | ICD-10-CM

## 2016-09-18 DIAGNOSIS — Z01411 Encounter for gynecological examination (general) (routine) with abnormal findings: Secondary | ICD-10-CM | POA: Diagnosis not present

## 2016-09-18 DIAGNOSIS — R232 Flushing: Secondary | ICD-10-CM | POA: Diagnosis not present

## 2016-09-18 DIAGNOSIS — Z01419 Encounter for gynecological examination (general) (routine) without abnormal findings: Secondary | ICD-10-CM | POA: Insufficient documentation

## 2016-09-18 LAB — HEMOCCULT GUIAC POC 1CARD (OFFICE): Fecal Occult Blood, POC: NEGATIVE

## 2016-09-18 NOTE — Progress Notes (Signed)
Patient ID: Erica Fry, female   DOB: [birth year], 55 y.o.   MRN: [id number] History of Present Illness: Erica Fry is a 55 year old white female in for a well woman gyn exam and pap. PCP is Dr Margo AyeHall.   Current Medications, Allergies, Past Medical History, Past Surgical History, Family History and Social History were reviewed in [facility] electronic [facility] record.     Review of Systems: Patient denies any headaches, hearing loss, fatigue, blurred vision, shortness of breath, chest pain, abdominal pain, problems with bowel movements, urination, or intercourse(not currently having sex). No joint pain or mood swings.Has occasional pina in LLQ. Has hot flashes still but better.Saw some blood after BM.   Physical Exam: General:  Well developed, well nourished, no acute distress Skin:  Warm and dry Neck:  Midline trachea, normal thyroid, good ROM, no lymphadenopathy Lungs; Clear to auscultation bilaterally Breast:  No dominant palpable mass, retraction, or nipple discharge Cardiovascular: Regular rate and rhythm Abdomen:  Soft, non tender, no hepatosplenomegaly Pelvic:  External genitalia is normal in appearance, no lesions.  The vagina is normal in appearance. Urethra has no lesions or masses.Mild cystocele noted. The cervix is bulbous. Pap with HPV performed. Uterus is felt to be normal size, shape, and contour.  No adnexal masses, LLQ tenderness noted.Bladder is non tender, no masses felt. Rectal: Good sphincter tone, no polyps, + hemorrhoids felt.  Hemoccult negative Extremities/musculoskeletal:  No swelling or varicosities noted, no clubbing or cyanosis Psych:  No mood changes, alert and cooperative,seems happy. She declines flu shot. PHQ 2 score 0.  Impression: 1. Encounter for gynecological examination with Papanicolaou smear of cervix   2. Hot flashes   3. Left lower quadrant abdominal tenderness without rebound tenderness       Plan:  Return in 1  Week for US Physical  in 1 year, pap in 3 if normal Labs with PCP  Colonoscopy per GI

## 2016-09-18 NOTE — Patient Instructions (Signed)
Return in 1  Week for US Physical in 1 year, pap in 3 if normal Labs with PCP  Colonoscopy per GI

## 2016-09-19 LAB — CYTOLOGY - PAP

## 2016-09-20 DIAGNOSIS — H524 Presbyopia: Secondary | ICD-10-CM | POA: Diagnosis not present

## 2016-09-20 DIAGNOSIS — H52221 Regular astigmatism, right eye: Secondary | ICD-10-CM | POA: Diagnosis not present

## 2016-09-20 DIAGNOSIS — H5203 Hypermetropia, bilateral: Secondary | ICD-10-CM | POA: Diagnosis not present

## 2016-09-25 ENCOUNTER — Other Ambulatory Visit: Payer: Self-pay | Admitting: Adult Health

## 2016-09-25 DIAGNOSIS — R1032 Left lower quadrant pain: Secondary | ICD-10-CM

## 2016-09-26 ENCOUNTER — Ambulatory Visit (INDEPENDENT_AMBULATORY_CARE_PROVIDER_SITE_OTHER): Payer: BLUE CROSS/BLUE SHIELD

## 2016-09-26 DIAGNOSIS — N854 Malposition of uterus: Secondary | ICD-10-CM

## 2016-09-26 DIAGNOSIS — R1032 Left lower quadrant pain: Secondary | ICD-10-CM

## 2016-09-26 NOTE — Progress Notes (Signed)
PELVIC US TA/TV: Heterogeneous anteverted uterus,EEC 1.8 mm,normal ov's bilat,no free fluid,ov's appear mobile

## 2016-09-27 ENCOUNTER — Telehealth: Payer: Self-pay | Admitting: Adult Health

## 2016-09-27 NOTE — Telephone Encounter (Signed)
left message US was normal

## 2016-10-09 DIAGNOSIS — Z85828 Personal history of other malignant neoplasm of skin: Secondary | ICD-10-CM | POA: Diagnosis not present

## 2016-10-09 DIAGNOSIS — X32XXXD Exposure to sunlight, subsequent encounter: Secondary | ICD-10-CM | POA: Diagnosis not present

## 2016-10-09 DIAGNOSIS — L570 Actinic keratosis: Secondary | ICD-10-CM | POA: Diagnosis not present

## 2016-10-09 DIAGNOSIS — Z08 Encounter for follow-up examination after completed treatment for malignant neoplasm: Secondary | ICD-10-CM | POA: Diagnosis not present

## 2016-10-09 DIAGNOSIS — L820 Inflamed seborrheic keratosis: Secondary | ICD-10-CM | POA: Diagnosis not present

## 2017-01-29 DIAGNOSIS — E039 Hypothyroidism, unspecified: Secondary | ICD-10-CM | POA: Diagnosis not present

## 2017-01-29 DIAGNOSIS — I10 Essential (primary) hypertension: Secondary | ICD-10-CM | POA: Diagnosis not present

## 2017-01-29 DIAGNOSIS — E782 Mixed hyperlipidemia: Secondary | ICD-10-CM | POA: Diagnosis not present

## 2017-01-31 DIAGNOSIS — E782 Mixed hyperlipidemia: Secondary | ICD-10-CM | POA: Diagnosis not present

## 2017-01-31 DIAGNOSIS — E039 Hypothyroidism, unspecified: Secondary | ICD-10-CM | POA: Diagnosis not present

## 2017-01-31 DIAGNOSIS — K219 Gastro-esophageal reflux disease without esophagitis: Secondary | ICD-10-CM | POA: Diagnosis not present

## 2017-01-31 DIAGNOSIS — I10 Essential (primary) hypertension: Secondary | ICD-10-CM | POA: Diagnosis not present

## 2017-02-13 DIAGNOSIS — M7731 Calcaneal spur, right foot: Secondary | ICD-10-CM | POA: Diagnosis not present

## 2017-02-13 DIAGNOSIS — M79671 Pain in right foot: Secondary | ICD-10-CM | POA: Diagnosis not present

## 2017-02-20 ENCOUNTER — Other Ambulatory Visit (HOSPITAL_COMMUNITY): Payer: Self-pay | Admitting: Internal Medicine

## 2017-02-20 DIAGNOSIS — Z1231 Encounter for screening mammogram for malignant neoplasm of breast: Secondary | ICD-10-CM

## 2017-02-26 ENCOUNTER — Ambulatory Visit (HOSPITAL_COMMUNITY)
Admission: RE | Admit: 2017-02-26 | Discharge: 2017-02-26 | Disposition: A | Discharge status: Home / Self Care | Payer: BLUE CROSS/BLUE SHIELD | Source: Ambulatory Visit | Attending: Internal Medicine | Admitting: Internal Medicine

## 2017-02-26 DIAGNOSIS — H8110 Benign paroxysmal vertigo, unspecified ear: Secondary | ICD-10-CM | POA: Diagnosis not present

## 2017-02-26 DIAGNOSIS — H9201 Otalgia, right ear: Secondary | ICD-10-CM | POA: Diagnosis not present

## 2017-02-26 DIAGNOSIS — Z1231 Encounter for screening mammogram for malignant neoplasm of breast: Secondary | ICD-10-CM | POA: Diagnosis not present

## 2017-06-25 DIAGNOSIS — S80811A Abrasion, right lower leg, initial encounter: Secondary | ICD-10-CM | POA: Diagnosis not present

## 2017-07-30 DIAGNOSIS — E039 Hypothyroidism, unspecified: Secondary | ICD-10-CM | POA: Diagnosis not present

## 2017-07-30 DIAGNOSIS — I10 Essential (primary) hypertension: Secondary | ICD-10-CM | POA: Diagnosis not present

## 2017-08-01 DIAGNOSIS — E039 Hypothyroidism, unspecified: Secondary | ICD-10-CM | POA: Diagnosis not present

## 2017-08-01 DIAGNOSIS — K219 Gastro-esophageal reflux disease without esophagitis: Secondary | ICD-10-CM | POA: Diagnosis not present

## 2017-08-01 DIAGNOSIS — Z0000 Encounter for general adult medical examination without abnormal findings: Secondary | ICD-10-CM | POA: Diagnosis not present

## 2017-08-01 DIAGNOSIS — I10 Essential (primary) hypertension: Secondary | ICD-10-CM | POA: Diagnosis not present

## 2017-08-05 ENCOUNTER — Encounter (HOSPITAL_COMMUNITY): Payer: Self-pay | Admitting: *Deleted

## 2017-08-05 ENCOUNTER — Emergency Department (HOSPITAL_COMMUNITY)
Admission: EM | Admit: 2017-08-05 | Discharge: 2017-08-06 | Disposition: A | Discharge status: Home / Self Care | Payer: BLUE CROSS/BLUE SHIELD | Attending: Emergency Medicine | Admitting: Emergency Medicine

## 2017-08-05 DIAGNOSIS — R002 Palpitations: Secondary | ICD-10-CM | POA: Insufficient documentation

## 2017-08-05 DIAGNOSIS — E039 Hypothyroidism, unspecified: Secondary | ICD-10-CM | POA: Insufficient documentation

## 2017-08-05 DIAGNOSIS — M25512 Pain in left shoulder: Secondary | ICD-10-CM | POA: Diagnosis not present

## 2017-08-05 DIAGNOSIS — I10 Essential (primary) hypertension: Secondary | ICD-10-CM | POA: Insufficient documentation

## 2017-08-05 DIAGNOSIS — Z7982 Long term (current) use of aspirin: Secondary | ICD-10-CM | POA: Insufficient documentation

## 2017-08-05 DIAGNOSIS — R51 Headache: Secondary | ICD-10-CM | POA: Diagnosis not present

## 2017-08-05 DIAGNOSIS — Z79899 Other long term (current) drug therapy: Secondary | ICD-10-CM | POA: Insufficient documentation

## 2017-08-05 LAB — BASIC METABOLIC PANEL
ANION GAP: 7 (ref 5–15)
BUN: 12 mg/dL (ref 6–20)
CHLORIDE: 106 mmol/L (ref 101–111)
CO2: 27 mmol/L (ref 22–32)
Calcium: 9.3 mg/dL (ref 8.9–10.3)
Creatinine, Ser: 0.84 mg/dL (ref 0.44–1.00)
GFR calc non Af Amer: >60 mL/min (ref >60)
GLUCOSE: 125 mg/dL — AB (ref 65–99)
POTASSIUM: 3.5 mmol/L (ref 3.5–5.1)
Sodium: 140 mmol/L (ref 135–145)

## 2017-08-05 LAB — CBC WITH DIFFERENTIAL/PLATELET
BASOS ABS: 0 10*3/uL (ref 0.0–0.1)
Basophils Relative: 0 %
Eosinophils Absolute: 0.1 10*3/uL (ref 0.0–0.7)
Eosinophils Relative: 2 %
HEMATOCRIT: 38.2 % (ref 36.0–46.0)
HEMOGLOBIN: 13.4 g/dL (ref 12.0–15.0)
LYMPHS PCT: 31 %
Lymphs Abs: 1.8 10*3/uL (ref 0.7–4.0)
MCH: 31.2 pg (ref 26.0–34.0)
MCHC: 35.1 g/dL (ref 30.0–36.0)
MCV: 88.8 fL (ref 78.0–100.0)
MONO ABS: 0.4 10*3/uL (ref 0.1–1.0)
Monocytes Relative: 7 %
NEUTROS ABS: 3.6 10*3/uL (ref 1.7–7.7)
NEUTROS PCT: 60 %
Platelets: 228 10*3/uL (ref 150–400)
RBC: 4.3 MIL/uL (ref 3.87–5.11)
RDW: 12.3 % (ref 11.5–15.5)
WBC: 5.9 10*3/uL (ref 4.0–10.5)

## 2017-08-05 LAB — TROPONIN I: Troponin I: <0.03 ng/mL (ref <0.03)

## 2017-08-05 NOTE — ED Triage Notes (Signed)
Pt reports high blood pressure for several day. Pt states she can feel her heart beat in her head and is having left shoulder pain that radiates down her left arm.

## 2017-08-05 NOTE — Discharge Instructions (Signed)

## 2017-08-06 NOTE — ED Provider Notes (Signed)
[facility] Provider Note   CSN: [id number] Arrival date & time: 08/05/17  2136     History   Chief Complaint Chief Complaint  Patient presents with  . Hypertension    HPI Erica Fry is a 56 y.o. female.  The history is provided by the patient.  Hypertension  This is a new problem. The current episode started more than 2 days ago. The problem occurs daily. The problem has been gradually improving. Associated symptoms include headaches. Pertinent negatives include no chest pain, no abdominal pain and no shortness of breath. Exacerbated by: lack of caffeine. Nothing relieves the symptoms.   Pt with known h/o HTN controlled with Benicar She reports she has PCP f/u last week with normal labs. This past weekend she completely cut out her daily 4 mountain dew soda drinks and the following day she began having mild HA The following day (yesterday) she began having feelings that her BP was elevated and when it was checked she noted it was high She called PCP who advised increasing her meds to 20mg  daily (she had been taking 10mg  daily) She reports over the past day she has had fatigue, pain in her left neck/shoulder and feeling "heart beat" No active CP/SOB No fever/vomiting No active HA at this time No focal weakness She is now feeling improved She denies h/o CAD/CVA  Past Medical History:  Diagnosis Date  . Abnormal Pap smear   . Cystocele 08/24/2013  . H/O hiatal hernia   . Hot flashes 09/05/2015  . Hypercholesteremia   . Hypertension   . PONV (postoperative nausea and vomiting)   . Thyroid disease   . Vaginal Pap smear, abnormal     Patient Active Problem List   Diagnosis Date Noted  . Hot flashes 09/05/2015  . Dyslipidemia 08/26/2013  . Hypothyroidism 08/26/2013  . Cystocele 08/24/2013  . Hypertension 08/24/2013  . History of abnormal Pap smear 08/24/2013    Past Surgical History:  Procedure Laterality Date  . CHOLECYSTECTOMY    . COLONOSCOPY   05/20/2012   Procedure: COLONOSCOPY;  Surgeon: Malissa Hippo, MD;  Location: [facility];  Service: Endoscopy;  Laterality: N/A;  830  . ENDOMETRIAL ABLATION    . FOOT BONE EXCISION     right  . TONSILLECTOMY    . TUBAL LIGATION      OB History    Gravida Para Term Preterm AB Living   2 2       2    SAB TAB Ectopic Multiple Live Births           2       Home Medications    Prior to Admission medications   Medication Sig Start Date End Date Taking? Authorizing Provider  aspirin 81 MG tablet Take 81 mg by mouth every evening.    Yes [provider]  Cyanocobalamin (B-12) 2500 MCG TABS Take 1 tablet by mouth daily.   Yes [provider]  levothyroxine (SYNTHROID, LEVOTHROID) 100 MCG tablet Take 100 mcg by mouth daily before breakfast.  07/29/16  Yes [provider]  olmesartan (BENICAR) 20 MG tablet Take 10-20 mg by mouth daily.    Yes [provider]  Omega-3 Fatty Acids (FISH OIL PO) Take 1,500 mg by mouth daily.   Yes [provider]  pantoprazole (PROTONIX) 40 MG tablet Take 40 mg by mouth every other day.  09/12/16  Yes [provider]  pravastatin (PRAVACHOL) 40 MG tablet Take 40 mg by mouth  every evening.  08/17/16  Yes [provider]  Probiotic Product (PROBIOTIC DAILY PO) Take 1 capsule by mouth daily. (PB8)   Yes [provider]    Family History Family History  Problem Relation Age of Onset  . Diabetes Mother   . Stroke Mother   . Hypertension Mother   . Congestive Heart Failure Mother   . Hypertension Father   . Cancer Father        prostate  . Diabetes Father   . Cancer Brother        prostate  . Diabetes Maternal Grandmother   . Stroke Maternal Grandmother   . Congestive Heart Failure Maternal Grandmother   . Emphysema Maternal Grandfather   . Diabetes Paternal Grandmother   . Hypertension Paternal Grandmother   . Stroke Paternal Grandmother   . Mental illness Paternal Grandfather         commited suicide  . Thyroid disease Daughter   . Cancer Paternal Aunt        breast  . Other Paternal Aunt        polio  . Cancer Maternal Aunt        lung    Social History Social History  Substance Use Topics  . Smoking status: Never Smoker  . Smokeless tobacco: Never Used  . Alcohol use No     Allergies   Patient has no known allergies.   Review of Systems Review of Systems  Constitutional: Positive for fatigue. Negative for fever.  Eyes: Negative for visual disturbance.  Respiratory: Negative for shortness of breath.   Cardiovascular: Negative for chest pain.  Gastrointestinal: Negative for abdominal pain and vomiting.  Neurological: Positive for headaches.  All other systems reviewed and are negative.    Physical Exam Updated Vital Signs BP (!) 138/98   Pulse 69   Temp 98.5 F (36.9 C) (Oral)   Resp 14   Ht 1.651 m (5\' 5" )   Wt 78 kg (172 lb)   SpO2 95%   BMI 28.62 kg/m   Physical Exam  CONSTITUTIONAL: Well developed/well nourished HEAD: Normocephalic/atraumatic EYES: EOMI/PERRL, no nystagmus, no ptosis ENMT: Mucous membranes moist NECK: supple no meningeal signs  SPINE/BACK:entire spine nontender CV: S1/S2 noted, no murmurs/rubs/gallops noted LUNGS: Lungs are clear to auscultation bilaterally, no apparent distress ABDOMEN: soft, nontender, no rebound or guarding GU:no cva tenderness NEURO:Awake/alert, face symmetric, no arm or leg drift is noted Equal 5/5 strength with shoulder abduction, elbow flex/extension, wrist flex/extension in upper extremities Equal 5/5 strength with hip flexion,knee flex/extension, foot dorsi/plantar flexion Cranial nerves 3/4/5/6/06/23/09/11/12 tested and intact Sensation to light touch intact in all extremities EXTREMITIES: pulses normal, full ROM SKIN: warm, color normal PSYCH: no abnormalities of mood noted, alert and oriented to situation   ED Treatments / Results  Labs (all labs ordered are listed, but only  abnormal results are displayed) Labs Reviewed  BASIC METABOLIC PANEL - Abnormal; Notable for the following:       Result Value   Glucose, Bld 125 (*)    All other components within normal limits  CBC WITH DIFFERENTIAL/PLATELET  TROPONIN I    EKG  EKG Interpretation  Date/Time:  Tuesday August 05 2017 21:52:52 EDT Ventricular Rate:  74 PR Interval:    QRS Duration: 86 QT Interval:  399 QTC Calculation: 443 R Axis:   38 Text Interpretation:  Sinus rhythm Probable left atrial enlargement Low voltage, precordial leads Abnormal R-wave progression, early transition No significant change since last tracing Confirmed by  Zadie Rhine ([id number]) on 08/05/2017 11:34:13 PM       Radiology No results found.  Procedures Procedures (including critical care time)  Medications Ordered in ED Medications - No data to display   Initial Impression / Assessment and Plan / ED Course  I have reviewed the triage vital signs and the nursing notes.  Pertinent labs   results that were available during my care of the patient were reviewed by me and considered in my medical decision making (see chart for details).     By the time of my eval, pt was improved No focal neuro deficits No active CP No EKG changes Troponin negative She is most concerned about her elevated BP Advised acutely stopping caffeine could contribute to this Otherwise, no signs of hypertensive emergency She would like to be discharged  Plan is to monitor BP 1-2x per day Record all BP measurements and call PCP if they continue to rise We discussed strict ER return precautions prior to PCP re-evaluation    Final Clinical Impressions(s) / ED Diagnoses   Final diagnoses:  Essential hypertension  Palpitations  Acute pain of left shoulder    New Prescriptions Discharge Medication List as of 08/05/2017 11:54 PM       Zadie Rhine, MD 08/06/17 0119

## 2017-08-09 DIAGNOSIS — H9319 Tinnitus, unspecified ear: Secondary | ICD-10-CM | POA: Diagnosis not present

## 2017-08-09 DIAGNOSIS — M25519 Pain in unspecified shoulder: Secondary | ICD-10-CM | POA: Diagnosis not present

## 2017-08-09 DIAGNOSIS — M542 Cervicalgia: Secondary | ICD-10-CM | POA: Diagnosis not present

## 2017-08-09 DIAGNOSIS — I10 Essential (primary) hypertension: Secondary | ICD-10-CM | POA: Diagnosis not present

## 2017-09-19 ENCOUNTER — Other Ambulatory Visit: Payer: BLUE CROSS/BLUE SHIELD | Admitting: Adult Health

## 2017-09-24 ENCOUNTER — Other Ambulatory Visit: Payer: BLUE CROSS/BLUE SHIELD | Admitting: Adult Health

## 2017-09-25 ENCOUNTER — Encounter: Payer: Self-pay | Admitting: Adult Health

## 2017-09-25 ENCOUNTER — Ambulatory Visit (INDEPENDENT_AMBULATORY_CARE_PROVIDER_SITE_OTHER): Payer: BLUE CROSS/BLUE SHIELD | Admitting: Adult Health

## 2017-09-25 VITALS — BP 124/80 | HR 78 | Ht 64.0 in | Wt 173.0 lb

## 2017-09-25 DIAGNOSIS — Z1211 Encounter for screening for malignant neoplasm of colon: Secondary | ICD-10-CM

## 2017-09-25 DIAGNOSIS — Z131 Encounter for screening for diabetes mellitus: Secondary | ICD-10-CM

## 2017-09-25 DIAGNOSIS — Z1212 Encounter for screening for malignant neoplasm of rectum: Secondary | ICD-10-CM | POA: Diagnosis not present

## 2017-09-25 DIAGNOSIS — N816 Rectocele: Secondary | ICD-10-CM | POA: Diagnosis not present

## 2017-09-25 DIAGNOSIS — N814 Uterovaginal prolapse, unspecified: Secondary | ICD-10-CM | POA: Diagnosis not present

## 2017-09-25 DIAGNOSIS — Z01419 Encounter for gynecological examination (general) (routine) without abnormal findings: Secondary | ICD-10-CM | POA: Insufficient documentation

## 2017-09-25 DIAGNOSIS — Z01411 Encounter for gynecological examination (general) (routine) with abnormal findings: Secondary | ICD-10-CM

## 2017-09-25 LAB — HEMOCCULT GUIAC POC 1CARD (OFFICE): Fecal Occult Blood, POC: NEGATIVE

## 2017-09-25 NOTE — Progress Notes (Signed)
Patient ID: Erica Fry, female   DOB: [birth year], 56 y.o.   MRN: [id number] History of Present Illness:  Erica Fry is a 56 year old white female, married, in for well woman gyn exam, had normal pap with negative HPV 09/18/17. PCP Dr Margo Aye.   Current Medications, Allergies, Past Medical History, Past Surgical History, Family History and Social History were reviewed in [name] record.     Review of Systems:  Patient denies any headaches, hearing loss, fatigue, blurred vision, shortness of breath, chest pain, abdominal pain, problems with bowel movements, urination, or intercourse(not currently having sex). No joint pain or mood swings. Feels like organs falling out at times.  Physical Exam:BP 124/80 (BP Location: Left Arm, Patient Position: Sitting, Cuff Size: Small)   Pulse 78   Ht  (1.626 m)   Wt 173 lb (78.5 kg)   BMI 29.70 kg/m  General:  Well developed, well nourished, no acute distress Skin:  Warm and dry Neck:  Midline trachea, normal thyroid, good ROM, no lymphadenopathy Lungs; Clear to auscultation bilaterally Breast:  No dominant palpable mass, retraction, or nipple discharge Cardiovascular: Regular rate and rhythm Abdomen:  Soft, non tender, no hepatosplenomegaly Pelvic:  External genitalia is normal in appearance, no lesions.  The vagina is normal in appearance. Urethra has normal appearance.+cystocele. The cervix is bulbous.  Uterus is felt to be normal size, shape, and contour.With descensus.   No adnexal masses or tenderness noted.Bladder is non tender, no masses felt. Rectal: Good sphincter tone, no polyps, or hemorrhoids felt.  Hemoccult negative.+high rectocele Extremities/musculoskeletal:  No swelling or varicosities noted, no clubbing or cyanosis Psych:  No mood changes, alert and cooperative,seems happy PHQ 2 score 0.  Impression: 1. Encounter for annual routine gynecological examination   2. Screening for colorectal cancer   3.  Cystocele with uterine prolapse   4. Rectocele   5. Screening for diabetes mellitus       Plan:  Check A1c Return in 1 week for pessary fitting with Dr Despina Hidden Physical in 1 year Pap in 2020 Mammogram yearly Labs with PCP

## 2017-09-26 ENCOUNTER — Telehealth: Payer: Self-pay | Admitting: Adult Health

## 2017-09-26 LAB — HEMOGLOBIN A1C
Est. average glucose Bld gHb Est-mCnc: 103 mg/dL
Hgb A1c MFr Bld: 5.2 % (ref 4.8–5.6)

## 2017-09-26 NOTE — Telephone Encounter (Signed)
Pt aware A1c 5.2 that is great

## 2017-10-02 ENCOUNTER — Encounter: Payer: Self-pay | Admitting: Obstetrics & Gynecology

## 2017-10-02 ENCOUNTER — Ambulatory Visit (INDEPENDENT_AMBULATORY_CARE_PROVIDER_SITE_OTHER): Payer: BLUE CROSS/BLUE SHIELD | Admitting: Obstetrics & Gynecology

## 2017-10-02 VITALS — BP 160/100 | HR 74 | Wt 163.0 lb

## 2017-10-02 DIAGNOSIS — N814 Uterovaginal prolapse, unspecified: Secondary | ICD-10-CM

## 2017-10-02 NOTE — Progress Notes (Signed)
Chief Complaint  Patient presents with  . pessary fitting    Blood pressure (!) 160/100, pulse 74, weight 163 lb (73.9 kg).  56 y.o. G2P2 No LMP recorded. Patient has had an ablation. The current method of family planning is tubal ligation.  Outpatient Encounter Prescriptions as of 10/02/2017  Medication Sig Note  . aspirin 81 MG tablet Take 81 mg by mouth every evening.    . Cyanocobalamin (B-12) 2500 MCG TABS Take 1 tablet by mouth daily.   Marland Kitchen levothyroxine (SYNTHROID, LEVOTHROID) 100 MCG tablet Take 100 mcg by mouth daily before breakfast.  09/18/2016: Received from: External Pharmacy  . olmesartan (BENICAR) 20 MG tablet Take 10-20 mg by mouth daily.  08/05/2017: May increase to 20mg  for elevated BP levels  . Omega-3 Fatty Acids (FISH OIL PO) Take 1,500 mg by mouth daily.   . pantoprazole (PROTONIX) 40 MG tablet Take 40 mg by mouth every other day.  09/18/2016: Received from: External Pharmacy  . pravastatin (PRAVACHOL) 40 MG tablet Take 40 mg by mouth every evening.  09/18/2016: Received from: External Pharmacy  . Probiotic Product (PROBIOTIC DAILY PO) Take 1 capsule by mouth daily. (PB8)    No facility-administered encounter medications on file as of 10/02/2017.     Subjective [name] in today for a follow-up visit She is referred from [facility] for evaluation of pelvic organ prolapse  Interestingly she states she saw me several years ago when I told her she had the beginnings of pelvic organ prolapse and she is holding the responsible for a getting worse(joking) She does have a history of caring for her invalid mother for 13 years and so that probably certainly hastened her defect Jennifer I1 meters see her and talked to her about remedies including fitting for a pessary She states she's been symptomatic for 2 years with with lower abdominal pulling almost like a muscle pulling She describes the pain is mild sometimes exacerbating much worse She denies any  bleeding She reports infrequent urinary loss usually when she is working out at [employer] Of course lifting and pulling makes her symptoms worse  See Complete PFSH at the end of this note  Objective General WDWN female NAD Vulva:  normal appearing vulva with no masses, tenderness or lesions Vagina:  Grade 3 cystocele in the supine position at rest progresses to grade 4 with bearing down, really no rectocele to speak of Cervix:  Normal in appearance no lesions no abnormalities recent Pap was normal Uterus:  Grade 2 uterine prolapse which is probably secondary to the bladder prolapse the uterus is small nontender normal post menopausal exam Adnexa: ovaries:,  Normal size nontender   Did a formal pessary fitting and fit her for a Milex ring support #4 which was called to materials in order today I'll see her back next Friday and place it at that time  Pertinent ROS No burning with urination, frequency or urgency No nausea, vomiting or diarrhea Nor fever chills or other constitutional symptoms   Labs or studies None ordered or reviewed    Impression Diagnoses this Encounter::   ICD-10-CM   1. Cystocele with uterine descensus N81.4    Grade III cytocoele with a Grade II uterine descent which is really due to the pull of the bladder    Established relevant diagnosis(es):   Plan/Recommendations: No orders of the defined types were placed in this encounter.   Labs or Scans Ordered: No orders of the defined types  were placed in this encounter.   Management:: Cordelia PenSherry is fit today for her pessary Milex ring #4 and of course that has support We discussed at length surgical option as well and which would include a vaginal hysterectomy removal of both tubes and ovaries and an anterior colporrhaphy with a vaginal vault suspension She understands that that would probably help for 10:15 years or maybe even more if she behaved and beyond that she may indeed require pessary in the  future For now she is can use the pessary but knows that an option and that's something that she can think about it and get back to me if she wants to proceed with that Otherwise we'll see her next week  Follow up Return in about 8 days (around 10/10/2017) for Follow up, with Dr Despina HiddenEure: pessary placement.        All questions were answered.  Past Medical History:  Diagnosis Date  . Abnormal Pap smear   . Cystocele 08/24/2013  . H/O hiatal hernia   . Hot flashes 09/05/2015  . Hypercholesteremia   . Hypertension   . Thyroid disease   . Vaginal Pap smear, abnormal     Past Surgical History:  Procedure Laterality Date  . CHOLECYSTECTOMY    . COLONOSCOPY  05/20/2012   Procedure: COLONOSCOPY;  Surgeon: Malissa HippoNajeeb U Rehman, MD;  Location: [facility];  Service: Endoscopy;  Laterality: N/A;  830  . ENDOMETRIAL ABLATION    . FOOT BONE EXCISION     right  . TONSILLECTOMY    . TUBAL LIGATION      OB History    Gravida Para Term Preterm AB Living   2 2       2    SAB TAB Ectopic Multiple Live Births           2      No Known Allergies  Social History   Social History  . Marital status: Married    Spouse name: N/A  . Number of children: N/A  . Years of education: N/A   Social History Main Topics  . Smoking status: Never Smoker  . Smokeless tobacco: Never Used  . Alcohol use No  . Drug use: No  . Sexual activity: Not Currently    Birth control/ protection: Surgical     Comment: tubal and ablation   Other Topics Concern  . None   Social History Narrative  . None    Family History  Problem Relation Age of Onset  . Diabetes Mother   . Stroke Mother   . Hypertension Mother   . Congestive Heart Failure Mother   . Hypertension Father   . Cancer Father        prostate  . Diabetes Father   . Cancer Brother        prostate  . Diabetes Maternal Grandmother   . Stroke Maternal Grandmother   . Congestive Heart Failure Maternal Grandmother   . Emphysema Maternal  Grandfather   . Diabetes Paternal Grandmother   . Hypertension Paternal Grandmother   . Stroke Paternal Grandmother   . Mental illness Paternal Grandfather        commited suicide  . Thyroid disease Daughter   . Cancer Paternal Aunt        breast  . Other Paternal Aunt        polio  . Cancer Maternal Aunt        lung

## 2017-10-10 ENCOUNTER — Encounter: Payer: Self-pay | Admitting: Obstetrics & Gynecology

## 2017-10-10 ENCOUNTER — Ambulatory Visit (INDEPENDENT_AMBULATORY_CARE_PROVIDER_SITE_OTHER): Payer: BLUE CROSS/BLUE SHIELD | Admitting: Obstetrics & Gynecology

## 2017-10-10 VITALS — BP 130/90 | HR 98 | Wt 171.3 lb

## 2017-10-10 DIAGNOSIS — Z4689 Encounter for fitting and adjustment of other specified devices: Secondary | ICD-10-CM | POA: Diagnosis not present

## 2017-10-10 DIAGNOSIS — N814 Uterovaginal prolapse, unspecified: Secondary | ICD-10-CM

## 2017-10-10 DIAGNOSIS — Z01419 Encounter for gynecological examination (general) (routine) without abnormal findings: Secondary | ICD-10-CM | POA: Diagnosis not present

## 2017-10-10 NOTE — Progress Notes (Signed)
Chief Complaint  Patient presents with  . pessary insertion   Erica Fry is seen today for insertion of her pessary At her visit last week I fit her for a Milex ring support #4 because of a grade 3-4 anterior  or bladder prolapse and grade 2 uterine prolapse She is here today to have it placed  Blood pressure 130/90, pulse 98, weight 171 lb 4.8 oz (77.7 kg). Her exam is unchanged Her bladder is a grade 3 prolapse in supine position with no straining in her uterus is grade 2 prolapse again in the supine position with no straining The Milex ring with support #4 is placed without difficulty There is good anchoring again no undue pressure on the vaginal mucosa It does not move with straining in the supine position  The patient tolerated the procedure well I will see her back in 1 month for initial post-insertion evaluation

## 2017-11-10 ENCOUNTER — Other Ambulatory Visit: Payer: Self-pay

## 2017-11-10 ENCOUNTER — Ambulatory Visit: Payer: BLUE CROSS/BLUE SHIELD | Admitting: Obstetrics & Gynecology

## 2017-11-10 ENCOUNTER — Encounter: Payer: Self-pay | Admitting: Obstetrics & Gynecology

## 2017-11-10 VITALS — BP 140/88 | HR 86 | Ht 64.0 in | Wt 171.0 lb

## 2017-11-10 DIAGNOSIS — Z4689 Encounter for fitting and adjustment of other specified devices: Secondary | ICD-10-CM | POA: Diagnosis not present

## 2017-11-10 DIAGNOSIS — N814 Uterovaginal prolapse, unspecified: Secondary | ICD-10-CM | POA: Diagnosis not present

## 2017-11-10 NOTE — Progress Notes (Signed)
Chief Complaint  Patient presents with  . Follow-up    pessary    Blood pressure 140/88, pulse 86, height 5\' 4"  (1.626 m), weight 171 lb (77.6 kg).  Erica Fry presents today for routine follow up related to her pessary.   She uses a Milex ring with support #4 She reports no vaginal discharge or vaginal bleeding.  Exam reveals no undue vaginal mucosal pressure of breakdown, no discharge and no vaginal bleeding.  The pessary is removed, cleaned and replaced without difficulty.    Erica Fry will be sen back in 3 months for continued follow up.  Erica ArmsLuther H Javious Hallisey, MD  11/10/2017 3:00 PM

## 2018-01-27 DIAGNOSIS — E782 Mixed hyperlipidemia: Secondary | ICD-10-CM | POA: Diagnosis not present

## 2018-01-27 DIAGNOSIS — E039 Hypothyroidism, unspecified: Secondary | ICD-10-CM | POA: Diagnosis not present

## 2018-01-27 DIAGNOSIS — I10 Essential (primary) hypertension: Secondary | ICD-10-CM | POA: Diagnosis not present

## 2018-01-30 DIAGNOSIS — Z0001 Encounter for general adult medical examination with abnormal findings: Secondary | ICD-10-CM | POA: Diagnosis not present

## 2018-02-10 ENCOUNTER — Encounter: Payer: Self-pay | Admitting: Obstetrics & Gynecology

## 2018-02-10 ENCOUNTER — Encounter (INDEPENDENT_AMBULATORY_CARE_PROVIDER_SITE_OTHER): Payer: Self-pay

## 2018-02-10 ENCOUNTER — Ambulatory Visit: Payer: BLUE CROSS/BLUE SHIELD | Admitting: Obstetrics & Gynecology

## 2018-02-10 VITALS — BP 118/70 | HR 83 | Ht 64.0 in | Wt 173.0 lb

## 2018-02-10 DIAGNOSIS — N814 Uterovaginal prolapse, unspecified: Secondary | ICD-10-CM | POA: Diagnosis not present

## 2018-02-10 DIAGNOSIS — Z4689 Encounter for fitting and adjustment of other specified devices: Secondary | ICD-10-CM | POA: Diagnosis not present

## 2018-02-10 NOTE — Progress Notes (Signed)
  Erica RochesterSherry W Weida is in today for ongoing management of her pessary She uses a Milex ring with support #4 and that has been in place since October 2018 She is doing well without complaints She denies bleeding or abnormal discharge  Blood pressure 118/70, pulse 83, height 5\' 4"  (1.626 m), weight 173 lb (78.5 kg).  On exam she has normal external genitalia The pessary was removed without difficulty The pessary is cleaned with soap and hot water The vagina is normal without any evidence of mucosal breakdown bleeding or discharge The pessary was replaced in the vagina without difficulty and is seated well  Erica PenSherry will be 4 months for ongoing pessary maintenance or prior to that time should she need

## 2018-02-13 ENCOUNTER — Other Ambulatory Visit: Payer: Self-pay | Admitting: Adult Health

## 2018-02-13 DIAGNOSIS — Z1231 Encounter for screening mammogram for malignant neoplasm of breast: Secondary | ICD-10-CM

## 2018-04-02 ENCOUNTER — Ambulatory Visit (HOSPITAL_COMMUNITY)
Admission: RE | Admit: 2018-04-02 | Discharge: 2018-04-02 | Disposition: A | Discharge status: Home / Self Care | Payer: BLUE CROSS/BLUE SHIELD | Source: Ambulatory Visit | Attending: Adult Health | Admitting: Adult Health

## 2018-04-02 ENCOUNTER — Encounter (HOSPITAL_COMMUNITY): Payer: Self-pay

## 2018-04-02 DIAGNOSIS — Z1231 Encounter for screening mammogram for malignant neoplasm of breast: Secondary | ICD-10-CM | POA: Diagnosis not present

## 2018-04-21 DIAGNOSIS — R1010 Upper abdominal pain, unspecified: Secondary | ICD-10-CM | POA: Diagnosis not present

## 2018-04-21 DIAGNOSIS — K219 Gastro-esophageal reflux disease without esophagitis: Secondary | ICD-10-CM | POA: Diagnosis not present

## 2018-04-21 DIAGNOSIS — E782 Mixed hyperlipidemia: Secondary | ICD-10-CM | POA: Diagnosis not present

## 2018-04-21 DIAGNOSIS — Z6829 Body mass index (BMI) 29.0-29.9, adult: Secondary | ICD-10-CM | POA: Diagnosis not present

## 2018-04-21 DIAGNOSIS — Z0001 Encounter for general adult medical examination with abnormal findings: Secondary | ICD-10-CM | POA: Diagnosis not present

## 2018-04-21 DIAGNOSIS — I10 Essential (primary) hypertension: Secondary | ICD-10-CM | POA: Diagnosis not present

## 2018-05-01 ENCOUNTER — Other Ambulatory Visit (HOSPITAL_COMMUNITY): Payer: Self-pay | Admitting: Internal Medicine

## 2018-05-01 DIAGNOSIS — R1010 Upper abdominal pain, unspecified: Secondary | ICD-10-CM

## 2018-05-01 DIAGNOSIS — M545 Low back pain: Secondary | ICD-10-CM

## 2018-05-07 ENCOUNTER — Ambulatory Visit (HOSPITAL_COMMUNITY)
Admission: RE | Admit: 2018-05-07 | Discharge: 2018-05-07 | Disposition: A | Discharge status: Home / Self Care | Payer: BLUE CROSS/BLUE SHIELD | Source: Ambulatory Visit | Attending: Internal Medicine | Admitting: Internal Medicine

## 2018-05-07 DIAGNOSIS — R1010 Upper abdominal pain, unspecified: Secondary | ICD-10-CM | POA: Diagnosis not present

## 2018-05-07 DIAGNOSIS — M545 Low back pain: Secondary | ICD-10-CM | POA: Diagnosis not present

## 2018-05-07 DIAGNOSIS — K760 Fatty (change of) liver, not elsewhere classified: Secondary | ICD-10-CM | POA: Insufficient documentation

## 2018-05-07 DIAGNOSIS — Z9049 Acquired absence of other specified parts of digestive tract: Secondary | ICD-10-CM | POA: Diagnosis not present

## 2018-06-09 ENCOUNTER — Ambulatory Visit: Payer: BLUE CROSS/BLUE SHIELD | Admitting: Obstetrics & Gynecology

## 2018-06-09 ENCOUNTER — Encounter: Payer: Self-pay | Admitting: Obstetrics & Gynecology

## 2018-06-09 VITALS — BP 146/84 | HR 69 | Ht 63.0 in | Wt 172.0 lb

## 2018-06-09 DIAGNOSIS — N814 Uterovaginal prolapse, unspecified: Secondary | ICD-10-CM | POA: Diagnosis not present

## 2018-06-09 DIAGNOSIS — Z4689 Encounter for fitting and adjustment of other specified devices: Secondary | ICD-10-CM

## 2018-06-09 NOTE — Progress Notes (Signed)
Chief Complaint  Patient presents with  . Pessary Check    Blood pressure (!) 146/84, pulse 69, height 5\' 3"  (1.6 m), weight 172 lb (78 kg).  [name] presents today for routine follow up related to her pessary.   She uses a Milex ring with support #4 She reports no vaginal discharge or vaginal bleeding.  Exam reveals no undue vaginal mucosal pressure of breakdown, no discharge and no vaginal bleeding.  The pessary is removed, cleaned and replaced without difficulty.    [name] will be sen back in 4 months for continued follow up.  Lazaro ArmsLuther H Dow Blahnik, MD  06/09/2018 10:31 AM

## 2018-06-11 ENCOUNTER — Ambulatory Visit: Payer: BLUE CROSS/BLUE SHIELD | Admitting: Obstetrics & Gynecology

## 2018-06-12 ENCOUNTER — Ambulatory Visit: Payer: BLUE CROSS/BLUE SHIELD | Admitting: Obstetrics & Gynecology

## 2018-07-29 DIAGNOSIS — Z0001 Encounter for general adult medical examination with abnormal findings: Secondary | ICD-10-CM | POA: Diagnosis not present

## 2018-07-29 DIAGNOSIS — E782 Mixed hyperlipidemia: Secondary | ICD-10-CM | POA: Diagnosis not present

## 2018-07-29 DIAGNOSIS — R7301 Impaired fasting glucose: Secondary | ICD-10-CM | POA: Diagnosis not present

## 2018-07-29 DIAGNOSIS — R1010 Upper abdominal pain, unspecified: Secondary | ICD-10-CM | POA: Diagnosis not present

## 2018-07-29 DIAGNOSIS — I10 Essential (primary) hypertension: Secondary | ICD-10-CM | POA: Diagnosis not present

## 2018-07-29 DIAGNOSIS — E039 Hypothyroidism, unspecified: Secondary | ICD-10-CM | POA: Diagnosis not present

## 2018-07-31 ENCOUNTER — Other Ambulatory Visit: Payer: Self-pay | Admitting: Internal Medicine

## 2018-07-31 DIAGNOSIS — Z780 Asymptomatic menopausal state: Secondary | ICD-10-CM

## 2018-07-31 DIAGNOSIS — I10 Essential (primary) hypertension: Secondary | ICD-10-CM | POA: Diagnosis not present

## 2018-07-31 DIAGNOSIS — K219 Gastro-esophageal reflux disease without esophagitis: Secondary | ICD-10-CM | POA: Diagnosis not present

## 2018-07-31 DIAGNOSIS — E039 Hypothyroidism, unspecified: Secondary | ICD-10-CM | POA: Diagnosis not present

## 2018-07-31 DIAGNOSIS — E782 Mixed hyperlipidemia: Secondary | ICD-10-CM | POA: Diagnosis not present

## 2018-08-28 ENCOUNTER — Ambulatory Visit (HOSPITAL_COMMUNITY)
Admission: RE | Admit: 2018-08-28 | Discharge: 2018-08-28 | Disposition: A | Discharge status: Home / Self Care | Payer: BLUE CROSS/BLUE SHIELD | Source: Ambulatory Visit | Attending: Internal Medicine | Admitting: Internal Medicine

## 2018-08-28 DIAGNOSIS — Z780 Asymptomatic menopausal state: Secondary | ICD-10-CM | POA: Diagnosis not present

## 2018-08-28 DIAGNOSIS — Z13820 Encounter for screening for osteoporosis: Secondary | ICD-10-CM | POA: Diagnosis not present

## 2018-09-28 ENCOUNTER — Ambulatory Visit: Payer: BLUE CROSS/BLUE SHIELD | Admitting: Adult Health

## 2018-09-28 ENCOUNTER — Encounter: Payer: Self-pay | Admitting: Adult Health

## 2018-09-28 VITALS — BP 115/76 | HR 72 | Ht 63.5 in | Wt 166.5 lb

## 2018-09-28 DIAGNOSIS — Z4689 Encounter for fitting and adjustment of other specified devices: Secondary | ICD-10-CM | POA: Diagnosis not present

## 2018-09-28 DIAGNOSIS — N814 Uterovaginal prolapse, unspecified: Secondary | ICD-10-CM

## 2018-09-28 DIAGNOSIS — Z1211 Encounter for screening for malignant neoplasm of colon: Secondary | ICD-10-CM | POA: Insufficient documentation

## 2018-09-28 DIAGNOSIS — Z01419 Encounter for gynecological examination (general) (routine) without abnormal findings: Secondary | ICD-10-CM | POA: Insufficient documentation

## 2018-09-28 DIAGNOSIS — Z1212 Encounter for screening for malignant neoplasm of rectum: Secondary | ICD-10-CM | POA: Diagnosis not present

## 2018-09-28 LAB — HEMOCCULT GUIAC POC 1CARD (OFFICE): FECAL OCCULT BLD: NEGATIVE

## 2018-09-28 NOTE — Progress Notes (Signed)
Patient ID: Erica Fry, female   DOB: 23-May-1961, 57 y.o.   MRN: [id number] History of Present Illness: Erica Fry is a 57 year old white female, married, PM in for well woman gyn exam and pessary cleaning.she had normal pap with negative HPV 09/18/16. PCP is Dr Erica Fry.    Current Medications, Allergies, Past Medical History, Past Surgical History, Family History and Social History were reviewed in [name] record.     Review of Systems: Patient denies any headaches, hearing loss, fatigue, blurred vision, shortness of breath, chest pain, abdominal pain, problems with bowel movements, urination, or intercourse(not active). No joint pain or Fry swings. She is happy with pessary.   Physical Exam:BP 115/76 (BP Location: Left Arm, Patient Position: Sitting, Cuff Size: Normal)   Pulse 72   Ht 5' 3.5" (1.613 m)   Wt 166 lb 8 oz (75.5 kg)   BMI 29.03 kg/m  General:  Well developed, well nourished, no acute distress Skin:  Warm and dry Neck:  Midline trachea, normal thyroid, good ROM, no lymphadenopathy Lungs; Clear to auscultation bilaterally Breast:  No dominant palpable mass, retraction, or nipple discharge Cardiovascular: Regular rate and rhythm Abdomen:  Soft, non tender, no hepatosplenomegaly Pelvic:  External genitalia is normal in appearance, no lesions.  The vagina is normal in appearance, +cystocele, pessary removed and cleaned. No lesions or irritation noted from pessary.Urethra has no lesions or masses. The cervix is smooth.  Uterus is felt to be normal size, shape, and contour.  No adnexal masses or tenderness noted.Bladder is non tender, no masses felt.Pessary reinserted. Rectal: Good sphincter tone, no polyps, or hemorrhoids felt.  Hemoccult negative. Extremities/musculoskeletal:  No swelling or varicosities noted, no clubbing or cyanosis Psych:  No Fry changes, alert and cooperative,seems happy PHQ 2 score 0. Examination chaperoned by Erica Mood  LPN.  Impression: 1. Encounter for well woman exam with routine gynecological exam   2. Screening for colorectal cancer   3. Pessary maintenance   4. Cystocele with uterine prolapse       Plan: Return in 3 months for pessary maintenance Pap and physical in 1 year Mammogram yearly Labs with PCP Colonoscopy per GI

## 2018-10-09 ENCOUNTER — Ambulatory Visit: Payer: BLUE CROSS/BLUE SHIELD | Admitting: Obstetrics & Gynecology

## 2018-10-23 DIAGNOSIS — D3132 Benign neoplasm of left choroid: Secondary | ICD-10-CM | POA: Diagnosis not present

## 2018-10-23 DIAGNOSIS — H52221 Regular astigmatism, right eye: Secondary | ICD-10-CM | POA: Diagnosis not present

## 2018-10-23 DIAGNOSIS — H5203 Hypermetropia, bilateral: Secondary | ICD-10-CM | POA: Diagnosis not present

## 2018-10-23 DIAGNOSIS — H18413 Arcus senilis, bilateral: Secondary | ICD-10-CM | POA: Diagnosis not present

## 2018-12-29 ENCOUNTER — Encounter: Payer: Self-pay | Admitting: Adult Health

## 2018-12-29 ENCOUNTER — Ambulatory Visit: Payer: BLUE CROSS/BLUE SHIELD | Admitting: Adult Health

## 2018-12-29 VITALS — BP 110/75 | HR 74 | Ht 63.0 in | Wt 173.0 lb

## 2018-12-29 DIAGNOSIS — Z4689 Encounter for fitting and adjustment of other specified devices: Secondary | ICD-10-CM

## 2018-12-29 DIAGNOSIS — N814 Uterovaginal prolapse, unspecified: Secondary | ICD-10-CM

## 2018-12-29 NOTE — Progress Notes (Signed)
Patient ID: Erica Fry, female   DOB: 1961/10/21, 58 y.o.   MRN: [id number] History of Present Illness: Erica Fry is a 58 year old white female in for pessary maintenance.No complaints. PCP Dr Margo Aye.    Current Medications, Allergies, Past Medical History, Past Surgical History, Family History and Social History were reviewed in [name] record.     Review of Systems: No discharge or pain, happy with pessary She had elevated albumin/creat ratio at Dr Marko Stai them repeat)   Physical Exam:BP 110/75 (BP Location: Left Arm, Patient Position: Sitting, Cuff Size: Normal)   Pulse 74   Ht 5\' 3"  (1.6 m)   Wt 173 lb (78.5 kg)   BMI 30.65 kg/m  General:  Well developed, well nourished, no acute distress Skin:  Warm and dry Pelvic:  External genitalia is normal in appearance, no lesions.  The vagina is normal in appearance.+cystocele.Pessary removed and cleaned with soap and water and dried and easily reinserted. Urethra has carbuncle. The cervix is smooth.  Uterus is felt to be normal size, shape, and contour.  No adnexal masses or tenderness noted.Bladder is non tender, no masses felt. Psych:  No mood changes, alert and cooperative,seems happy Fall risk is low.  Impression: 1. Pessary maintenance   2. Cystocele with uterine prolapse       Plan: Return in 3 months for pessary maintenance

## 2019-02-08 DIAGNOSIS — E039 Hypothyroidism, unspecified: Secondary | ICD-10-CM | POA: Diagnosis not present

## 2019-02-08 DIAGNOSIS — I10 Essential (primary) hypertension: Secondary | ICD-10-CM | POA: Diagnosis not present

## 2019-02-08 DIAGNOSIS — E782 Mixed hyperlipidemia: Secondary | ICD-10-CM | POA: Diagnosis not present

## 2019-02-12 DIAGNOSIS — E039 Hypothyroidism, unspecified: Secondary | ICD-10-CM | POA: Diagnosis not present

## 2019-02-12 DIAGNOSIS — N182 Chronic kidney disease, stage 2 (mild): Secondary | ICD-10-CM | POA: Diagnosis not present

## 2019-02-12 DIAGNOSIS — E782 Mixed hyperlipidemia: Secondary | ICD-10-CM | POA: Diagnosis not present

## 2019-02-12 DIAGNOSIS — I129 Hypertensive chronic kidney disease with stage 1 through stage 4 chronic kidney disease, or unspecified chronic kidney disease: Secondary | ICD-10-CM | POA: Diagnosis not present

## 2019-03-08 DIAGNOSIS — J019 Acute sinusitis, unspecified: Secondary | ICD-10-CM | POA: Diagnosis not present

## 2019-03-29 ENCOUNTER — Telehealth: Payer: Self-pay | Admitting: *Deleted

## 2019-03-29 NOTE — Telephone Encounter (Signed)
Spoke with pt reminding her of tomorrow's appt. Pt wishes to reschedule appt for a month for pessary maint. She is not having any problems. Advised that was fine. Pt is having "allergy symptoms" but don't want to bring anything to the office.  Attempted to transfer call to front desk but advised if we got disconnected, call back to reschedule. Pt voiced understanding. JSY

## 2019-03-30 ENCOUNTER — Ambulatory Visit: Payer: Self-pay | Admitting: Adult Health

## 2019-05-03 ENCOUNTER — Other Ambulatory Visit (HOSPITAL_COMMUNITY): Payer: Self-pay | Admitting: Adult Health

## 2019-05-03 DIAGNOSIS — Z1231 Encounter for screening mammogram for malignant neoplasm of breast: Secondary | ICD-10-CM

## 2019-05-14 ENCOUNTER — Other Ambulatory Visit: Payer: Self-pay

## 2019-05-14 ENCOUNTER — Ambulatory Visit (HOSPITAL_COMMUNITY)
Admission: RE | Admit: 2019-05-14 | Discharge: 2019-05-14 | Disposition: A | Discharge status: Home / Self Care | Payer: BLUE CROSS/BLUE SHIELD | Source: Ambulatory Visit | Attending: Adult Health | Admitting: Adult Health

## 2019-05-14 DIAGNOSIS — Z1231 Encounter for screening mammogram for malignant neoplasm of breast: Secondary | ICD-10-CM | POA: Insufficient documentation

## 2019-06-23 ENCOUNTER — Telehealth: Payer: Self-pay | Admitting: *Deleted

## 2019-06-23 NOTE — Telephone Encounter (Signed)
Patient called wanting to schedule an appt for pressury with Anderson Malta. [phone]

## 2019-07-12 ENCOUNTER — Encounter: Payer: BLUE CROSS/BLUE SHIELD | Admitting: Obstetrics & Gynecology

## 2019-07-15 ENCOUNTER — Ambulatory Visit (INDEPENDENT_AMBULATORY_CARE_PROVIDER_SITE_OTHER): Payer: BC Managed Care – PPO | Admitting: Adult Health

## 2019-07-15 ENCOUNTER — Encounter: Payer: Self-pay | Admitting: Adult Health

## 2019-07-15 ENCOUNTER — Other Ambulatory Visit: Payer: Self-pay

## 2019-07-15 VITALS — BP 132/77 | HR 77 | Ht 64.0 in | Wt 175.0 lb

## 2019-07-15 DIAGNOSIS — N814 Uterovaginal prolapse, unspecified: Secondary | ICD-10-CM

## 2019-07-15 DIAGNOSIS — Z4689 Encounter for fitting and adjustment of other specified devices: Secondary | ICD-10-CM

## 2019-07-15 NOTE — Progress Notes (Signed)
Patient ID: [name], female   DOB: Jun 02, 1961, 58 y.o.   MRN: [id number] History of Present Illness: Darnisha is a 58 year old white female, married, PM in for pessary cleaning she last had it done in January 2020 and COVID came, has not problems, even though it has been 6 months. PCP is Dr Nevada Crane.    Current Medications, Allergies, Past Medical History, Past Surgical History, Family History and Social History were reviewed in [employer] record.     Review of Systems:  For pessary maintenance no complaints of irritation or odor   Physical Exam:BP 132/77 (BP Location: Left Arm, Patient Position: Sitting, Cuff Size: Normal)   Pulse 77   Ht 5\' 4"  (1.626 m)   Wt 175 lb (79.4 kg)   BMI 30.04 kg/m  General:  Well developed, well nourished, no acute distress Skin:  Warm and dry Pelvic:  External genitalia is normal in appearance, no lesions. The #4 Milex ring with support is easily removed, and cleaned. The vagina is pale, without odor or any vaginal pressure changes, +cyctoele..The pessary is easily reinserted.  Psych:  No mood changes, alert and cooperative,seems happy Fall risk is low. Examination chaperoned by Levy Pupa LPN.  Impression: 1. Pessary maintenance   2. Cystocele with uterine prolapse       Plan: Return in 3 months for pap and physical and will clean pessary then.

## 2019-08-04 DIAGNOSIS — E039 Hypothyroidism, unspecified: Secondary | ICD-10-CM | POA: Diagnosis not present

## 2019-08-04 DIAGNOSIS — E782 Mixed hyperlipidemia: Secondary | ICD-10-CM | POA: Diagnosis not present

## 2019-08-04 DIAGNOSIS — I10 Essential (primary) hypertension: Secondary | ICD-10-CM | POA: Diagnosis not present

## 2019-08-13 DIAGNOSIS — Z0001 Encounter for general adult medical examination with abnormal findings: Secondary | ICD-10-CM | POA: Diagnosis not present

## 2019-08-13 DIAGNOSIS — N182 Chronic kidney disease, stage 2 (mild): Secondary | ICD-10-CM | POA: Diagnosis not present

## 2019-08-13 DIAGNOSIS — I129 Hypertensive chronic kidney disease with stage 1 through stage 4 chronic kidney disease, or unspecified chronic kidney disease: Secondary | ICD-10-CM | POA: Diagnosis not present

## 2019-08-13 DIAGNOSIS — E782 Mixed hyperlipidemia: Secondary | ICD-10-CM | POA: Diagnosis not present

## 2019-09-21 DIAGNOSIS — L905 Scar conditions and fibrosis of skin: Secondary | ICD-10-CM | POA: Diagnosis not present

## 2019-09-21 DIAGNOSIS — D485 Neoplasm of uncertain behavior of skin: Secondary | ICD-10-CM | POA: Diagnosis not present

## 2019-09-21 DIAGNOSIS — L570 Actinic keratosis: Secondary | ICD-10-CM | POA: Diagnosis not present

## 2019-09-21 DIAGNOSIS — X32XXXD Exposure to sunlight, subsequent encounter: Secondary | ICD-10-CM | POA: Diagnosis not present

## 2019-09-21 DIAGNOSIS — D225 Melanocytic nevi of trunk: Secondary | ICD-10-CM | POA: Diagnosis not present

## 2019-09-21 DIAGNOSIS — Z85828 Personal history of other malignant neoplasm of skin: Secondary | ICD-10-CM | POA: Diagnosis not present

## 2019-09-21 DIAGNOSIS — Z1283 Encounter for screening for malignant neoplasm of skin: Secondary | ICD-10-CM | POA: Diagnosis not present

## 2019-09-21 DIAGNOSIS — Z08 Encounter for follow-up examination after completed treatment for malignant neoplasm: Secondary | ICD-10-CM | POA: Diagnosis not present

## 2019-10-14 ENCOUNTER — Other Ambulatory Visit (HOSPITAL_COMMUNITY)
Admission: RE | Admit: 2019-10-14 | Discharge: 2019-10-14 | Disposition: A | Discharge status: Home / Self Care | Payer: BC Managed Care – PPO | Source: Ambulatory Visit | Attending: Adult Health | Admitting: Adult Health

## 2019-10-14 ENCOUNTER — Ambulatory Visit (INDEPENDENT_AMBULATORY_CARE_PROVIDER_SITE_OTHER): Payer: BC Managed Care – PPO | Admitting: Adult Health

## 2019-10-14 ENCOUNTER — Encounter: Payer: Self-pay | Admitting: Adult Health

## 2019-10-14 ENCOUNTER — Other Ambulatory Visit: Payer: Self-pay

## 2019-10-14 VITALS — BP 106/73 | HR 85 | Ht 63.5 in | Wt 173.0 lb

## 2019-10-14 DIAGNOSIS — Z1212 Encounter for screening for malignant neoplasm of rectum: Secondary | ICD-10-CM | POA: Diagnosis not present

## 2019-10-14 DIAGNOSIS — Z01419 Encounter for gynecological examination (general) (routine) without abnormal findings: Secondary | ICD-10-CM | POA: Insufficient documentation

## 2019-10-14 DIAGNOSIS — N814 Uterovaginal prolapse, unspecified: Secondary | ICD-10-CM

## 2019-10-14 DIAGNOSIS — Z4689 Encounter for fitting and adjustment of other specified devices: Secondary | ICD-10-CM

## 2019-10-14 DIAGNOSIS — Z1211 Encounter for screening for malignant neoplasm of colon: Secondary | ICD-10-CM

## 2019-10-14 LAB — HEMOCCULT GUIAC POC 1CARD (OFFICE): Fecal Occult Blood, POC: NEGATIVE

## 2019-10-14 NOTE — Progress Notes (Signed)
Patient ID: Erica Fry, female   DOB: 1961/08/02, 58 y.o.   MRN: [id number] History of Present Illness: Erica Fry is a 58 year old white female, married, PM with pessary in for well woman gyn exam and pap.  PCP is Dr hall.   Current Medications, Allergies, Past Medical History, Past Surgical History, Family History and Social History were reviewed in [employer] record.     Review of Systems: Patient denies any headaches, hearing loss, fatigue, blurred vision, shortness of breath, chest pain, abdominal pain, problems with bowel movements, urination, or intercourse(not active). No joint pain or mood swings. Has had vertigo like 3 x this year and uses Antivert     Physical Exam:BP 106/73 (BP Location: Left Arm, Patient Position: Sitting, Cuff Size: Normal)   Pulse 85   Ht 5' 3.5" (1.613 m)   Wt 173 lb (78.5 kg)   BMI 30.16 kg/m  General:  Well developed, well nourished, no acute distress Skin:  Warm and dry Neck:  Midline trachea, normal thyroid, good ROM, no lymphadenopathy Lungs; Clear to auscultation bilaterally Breast:  No dominant palpable mass, retraction, or nipple discharge Cardiovascular: Regular rate and rhythm Abdomen:  Soft, non tender, no hepatosplenomegaly Pelvic:  External genitalia is normal in appearance, no lesions.  The vagina is normal in appearance, +cystocele. Urethra has no lesions or masses. The cervix is smooth, Pessary removed and cleaned.Pap with high risk HPV 16/18 genotyping performed.And pessary reinserted.  Uterus is felt to be normal size, shape, and contour.  No adnexal masses or tenderness noted.Bladder is non tender, no masses felt. Rectal: Good sphincter tone, no polyps, or hemorrhoids felt.  Hemoccult negative. Extremities/musculoskeletal:  No swelling or varicosities noted, no clubbing or cyanosis Psych:  No mood changes, alert and cooperative,seems happy Fall risk iw ls lo PHQ 2 score is 0. Examination chaperoned by Weyman Croon, FNP, student, who assisted with exam.  Impression and Plan:  1. Encounter for gynecological examination with Papanicolaou smear of cervix Pap sent Physical in 1 year Pap in 3 if normal Mammogram yearly Labs with PCP  2. Screening for colorectal cancer  3. Cystocele with uterine prolapse   4. Pessary maintenance Return in in 6 months for pessary maintenance

## 2019-10-20 LAB — CYTOLOGY - PAP
Comment: NEGATIVE
Diagnosis: NEGATIVE
High risk HPV: NEGATIVE

## 2019-11-06 DIAGNOSIS — H1033 Unspecified acute conjunctivitis, bilateral: Secondary | ICD-10-CM | POA: Diagnosis not present

## 2020-02-10 DIAGNOSIS — E039 Hypothyroidism, unspecified: Secondary | ICD-10-CM | POA: Diagnosis not present

## 2020-02-10 DIAGNOSIS — E782 Mixed hyperlipidemia: Secondary | ICD-10-CM | POA: Diagnosis not present

## 2020-02-10 DIAGNOSIS — I10 Essential (primary) hypertension: Secondary | ICD-10-CM | POA: Diagnosis not present

## 2020-02-14 DIAGNOSIS — I129 Hypertensive chronic kidney disease with stage 1 through stage 4 chronic kidney disease, or unspecified chronic kidney disease: Secondary | ICD-10-CM | POA: Diagnosis not present

## 2020-02-14 DIAGNOSIS — E039 Hypothyroidism, unspecified: Secondary | ICD-10-CM | POA: Diagnosis not present

## 2020-02-14 DIAGNOSIS — K219 Gastro-esophageal reflux disease without esophagitis: Secondary | ICD-10-CM | POA: Diagnosis not present

## 2020-02-14 DIAGNOSIS — E782 Mixed hyperlipidemia: Secondary | ICD-10-CM | POA: Diagnosis not present

## 2020-04-13 ENCOUNTER — Other Ambulatory Visit: Payer: Self-pay

## 2020-04-13 ENCOUNTER — Encounter: Payer: Self-pay | Admitting: Adult Health

## 2020-04-13 ENCOUNTER — Ambulatory Visit (INDEPENDENT_AMBULATORY_CARE_PROVIDER_SITE_OTHER): Payer: BC Managed Care – PPO | Admitting: Adult Health

## 2020-04-13 VITALS — BP 114/80 | HR 74 | Ht 63.5 in | Wt 178.0 lb

## 2020-04-13 DIAGNOSIS — N814 Uterovaginal prolapse, unspecified: Secondary | ICD-10-CM

## 2020-04-13 DIAGNOSIS — Z4689 Encounter for fitting and adjustment of other specified devices: Secondary | ICD-10-CM

## 2020-04-13 DIAGNOSIS — Z466 Encounter for fitting and adjustment of urinary device: Secondary | ICD-10-CM

## 2020-04-13 NOTE — Progress Notes (Signed)
  Subjective:     Patient ID: Erica Fry, female   DOB: 09/26/1961, 59 y.o.   MRN: [id number]  HPI Erica Fry is a 59 year old white female,married,PM in for pessary maintenance, no odor,slight yellow discharge at times. PCP is Dr Margo Aye.   Review of Systems No odor, slight yellow discharge at times No pain Reviewed past medical,surgical, social and family history. Reviewed medications and allergies.     Objective:   Physical Exam BP 114/80 (BP Location: Left Arm, Patient Position: Sitting, Cuff Size: Normal)   Pulse 74   Ht 5' 3.5" (1.613 m)   Wt 178 lb (80.7 kg)   BMI 31.04 kg/m    Skin warm and dry.Pelvic: external genitalia is normal in appearance no lesions, vagina: Milex # 4 ring easily removed,no odor, and pessary washed with soap and water and dried, no irritation or bleeding noted, tissue is pale, +cystocle, pessary reinserted,urethra has no lesions or masses noted, cervix: bulbous, uterus: normal size, shape and contour, non tender, no masses felt, adnexa: no masses or tenderness noted. Bladder is non tender and no masses felt.  Examination chaperoned by Faith Rogue LPN.  Assessment:     1. Pessary maintenance Follow up in 6 months or sooner if needed  2. Cystocele with uterine prolapse Has pessary  milex # 4 ring    Plan:      Return in 6 months for physical and pessary maintenance

## 2020-06-06 ENCOUNTER — Other Ambulatory Visit (HOSPITAL_COMMUNITY): Payer: Self-pay | Admitting: Adult Health

## 2020-06-06 DIAGNOSIS — Z1231 Encounter for screening mammogram for malignant neoplasm of breast: Secondary | ICD-10-CM

## 2020-07-26 ENCOUNTER — Other Ambulatory Visit: Payer: Self-pay

## 2020-07-26 ENCOUNTER — Ambulatory Visit (HOSPITAL_COMMUNITY)
Admission: RE | Admit: 2020-07-26 | Discharge: 2020-07-26 | Disposition: A | Discharge status: Home / Self Care | Payer: BC Managed Care – PPO | Source: Ambulatory Visit | Attending: Adult Health | Admitting: Adult Health

## 2020-07-26 DIAGNOSIS — Z1231 Encounter for screening mammogram for malignant neoplasm of breast: Secondary | ICD-10-CM | POA: Diagnosis not present

## 2020-08-22 DIAGNOSIS — H1033 Unspecified acute conjunctivitis, bilateral: Secondary | ICD-10-CM | POA: Diagnosis not present

## 2020-08-22 DIAGNOSIS — I129 Hypertensive chronic kidney disease with stage 1 through stage 4 chronic kidney disease, or unspecified chronic kidney disease: Secondary | ICD-10-CM | POA: Diagnosis not present

## 2020-08-22 DIAGNOSIS — E663 Overweight: Secondary | ICD-10-CM | POA: Diagnosis not present

## 2020-08-22 DIAGNOSIS — Z20828 Contact with and (suspected) exposure to other viral communicable diseases: Secondary | ICD-10-CM | POA: Diagnosis not present

## 2020-08-22 DIAGNOSIS — I10 Essential (primary) hypertension: Secondary | ICD-10-CM | POA: Diagnosis not present

## 2020-08-22 DIAGNOSIS — J019 Acute sinusitis, unspecified: Secondary | ICD-10-CM | POA: Diagnosis not present

## 2020-08-22 DIAGNOSIS — E039 Hypothyroidism, unspecified: Secondary | ICD-10-CM | POA: Diagnosis not present

## 2020-08-29 DIAGNOSIS — Z0001 Encounter for general adult medical examination with abnormal findings: Secondary | ICD-10-CM | POA: Diagnosis not present

## 2020-10-16 ENCOUNTER — Other Ambulatory Visit: Payer: BC Managed Care – PPO | Admitting: Adult Health

## 2020-10-16 ENCOUNTER — Encounter: Payer: Self-pay | Admitting: Obstetrics & Gynecology

## 2020-10-16 ENCOUNTER — Ambulatory Visit (INDEPENDENT_AMBULATORY_CARE_PROVIDER_SITE_OTHER): Payer: BC Managed Care – PPO | Admitting: Obstetrics & Gynecology

## 2020-10-16 VITALS — BP 124/83 | HR 73 | Ht 63.0 in | Wt 177.0 lb

## 2020-10-16 DIAGNOSIS — Z1212 Encounter for screening for malignant neoplasm of rectum: Secondary | ICD-10-CM

## 2020-10-16 DIAGNOSIS — Z4689 Encounter for fitting and adjustment of other specified devices: Secondary | ICD-10-CM

## 2020-10-16 DIAGNOSIS — Z01419 Encounter for gynecological examination (general) (routine) without abnormal findings: Secondary | ICD-10-CM

## 2020-10-16 LAB — HEMOCCULT GUIAC POC 1CARD (OFFICE): Fecal Occult Blood, POC: NEGATIVE

## 2020-10-16 NOTE — Addendum Note (Signed)
Addended by: Moss Mc on: 10/16/2020 02:43 PM   Modules accepted: Orders

## 2020-10-16 NOTE — Progress Notes (Signed)
Subjective:     Erica Fry is a 59 y.o. female here for a routine exam.  No LMP recorded. Patient has had an ablation. G2P2 Birth Control Method:  menopause Menstrual Calendar(currently): amenorrhea  Current complaints: none.   Current acute medical issues:  none   Recent Gynecologic History No LMP recorded. Patient has had an ablation. Last Pap: 2020,  normal Last mammogram: 8/21,  normal  Past Medical History:  Diagnosis Date  . Abnormal Pap smear   . Cystocele 08/24/2013  . H/O hiatal hernia   . Hot flashes 09/05/2015  . Hypercholesteremia   . Hypertension   . Thyroid disease   . Vaginal Pap smear, abnormal     Past Surgical History:  Procedure Laterality Date  . CHOLECYSTECTOMY    . COLONOSCOPY  05/20/2012   Procedure: COLONOSCOPY;  Surgeon: Malissa Hippo, MD;  Location: [facility];  Service: Endoscopy;  Laterality: N/A;  830  . ENDOMETRIAL ABLATION    . FOOT BONE EXCISION     right  . TONSILLECTOMY    . TUBAL LIGATION      OB History    Gravida  2   Para  2   Term      Preterm      AB      Living  2     SAB      TAB      Ectopic      Multiple      Live Births  2           Social History   Socioeconomic History  . Marital status: Married    Spouse name: Not on file  . Number of children: 2  . Years of education: Not on file  . Highest education level: Not on file  Occupational History  . Not on file  Tobacco Use  . Smoking status: Never Smoker  . Smokeless tobacco: Never Used  Vaping Use  . Vaping Use: Never used  Substance and Sexual Activity  . Alcohol use: No  . Drug use: No  . Sexual activity: Not Currently    Birth control/protection: Surgical    Comment: tubal and ablation  Other Topics Concern  . Not on file  Social History Narrative  . Not on file   Social Determinants of Health   Financial Resource Strain:   . Difficulty of Paying Living Expenses: Not on file  Food Insecurity:   . Worried About Community education officer in the Last Year: Not on file  . Ran Out of Food in the Last Year: Not on file  Transportation Needs:   . Lack of Transportation (Medical): Not on file  . Lack of Transportation (Non-Medical): Not on file  Physical Activity:   . Days of Exercise per Week: Not on file  . Minutes of Exercise per Session: Not on file  Stress:   . Feeling of Stress : Not on file  Social Connections:   . Frequency of Communication with Friends and Family: Not on file  . Frequency of Social Gatherings with Friends and Family: Not on file  . Attends Religious Services: Not on file  . Active Member of Clubs or Organizations: Not on file  . Attends Banker Meetings: Not on file  . Marital Status: Not on file    Family History  Problem Relation Age of Onset  . Diabetes Mother   . Stroke Mother   . Hypertension Mother   .  Congestive Heart Failure Mother   . Hypertension Father   . Cancer Father        prostate  . Diabetes Father   . Cancer Brother        prostate  . Diabetes Maternal Grandmother   . Stroke Maternal Grandmother   . Congestive Heart Failure Maternal Grandmother   . Emphysema Maternal Grandfather   . Diabetes Paternal Grandmother   . Hypertension Paternal Grandmother   . Stroke Paternal Grandmother   . Mental illness Paternal Grandfather        commited suicide  . Thyroid disease Daughter   . Cancer Paternal Aunt        breast  . Other Paternal Aunt        polio  . Cancer Maternal Aunt        lung     Current Outpatient Medications:  .  aspirin 81 MG tablet, Take 81 mg by mouth every evening. , Disp: , Rfl:  .  Calcium Carbonate-Vitamin D (CALTRATE 600+D PO), Take by mouth daily. , Disp: , Rfl:  .  Cyanocobalamin (B-12) 2500 MCG TABS, Take 1 tablet by mouth daily., Disp: , Rfl:  .  levothyroxine (SYNTHROID, LEVOTHROID) 100 MCG tablet, Take 100 mcg by mouth daily before breakfast. , Disp: , Rfl: 4 .  meclizine (ANTIVERT) 25 MG tablet, Take 25 mg by  mouth as needed for dizziness., Disp: , Rfl:  .  olmesartan (BENICAR) 20 MG tablet, Take 10-20 mg by mouth daily. , Disp: , Rfl:  .  omeprazole (PRILOSEC) 40 MG capsule, Take 40 mg by mouth daily., Disp: , Rfl:  .  pantoprazole (PROTONIX) 40 MG tablet, Take 40 mg by mouth daily. , Disp: , Rfl: 2 .  pravastatin (PRAVACHOL) 40 MG tablet, Take 40 mg by mouth every evening. , Disp: , Rfl: 4 .  Probiotic Product (PROBIOTIC DAILY PO), Take 1 capsule by mouth daily. (PB8), Disp: , Rfl:   Review of Systems  Review of Systems  Constitutional: Negative for fever, chills, weight loss, malaise/fatigue and diaphoresis.  HENT: Negative for hearing loss, ear pain, nosebleeds, congestion, sore throat, neck pain, tinnitus and ear discharge.   Eyes: Negative for blurred vision, double vision, photophobia, pain, discharge and redness.  Respiratory: Negative for cough, hemoptysis, sputum production, shortness of breath, wheezing and stridor.   Cardiovascular: Negative for chest pain, palpitations, orthopnea, claudication, leg swelling and PND.  Gastrointestinal: negative for abdominal pain. Negative for heartburn, nausea, vomiting, diarrhea, constipation, blood in stool and melena.  Genitourinary: Negative for dysuria, urgency, frequency, hematuria and flank pain.  Musculoskeletal: Negative for myalgias, back pain, joint pain and falls.  Skin: Negative for itching and rash.  Neurological: Negative for dizziness, tingling, tremors, sensory change, speech change, focal weakness, seizures, loss of consciousness, weakness and headaches.  Endo/Heme/Allergies: Negative for environmental allergies and polydipsia. Does not bruise/bleed easily.  Psychiatric/Behavioral: Negative for depression, suicidal ideas, hallucinations, memory loss and substance abuse. The patient is not nervous/anxious and does not have insomnia.        Objective:  There were no vitals taken for this visit.   Physical Exam  Vitals  reviewed. Constitutional: She is oriented to person, place, and time. She appears well-developed and well-nourished.  HENT:  Head: Normocephalic and atraumatic.        Right Ear: External ear normal.  Left Ear: External ear normal.  Nose: Nose normal.  Mouth/Throat: Oropharynx is clear and moist.  Eyes: Conjunctivae and EOM are normal. Pupils are  equal, round, and reactive to light. Right eye exhibits no discharge. Left eye exhibits no discharge. No scleral icterus.  Neck: Normal range of motion. Neck supple. No tracheal deviation present. No thyromegaly present.  Cardiovascular: Normal rate, regular rhythm, normal heart sounds and intact distal pulses.  Exam reveals no gallop and no friction rub.   No murmur heard. Respiratory: Effort normal and breath sounds normal. No respiratory distress. She has no wheezes. She has no rales. She exhibits no tenderness.  GI: Soft. Bowel sounds are normal. She exhibits no distension and no mass. There is no tenderness. There is no rebound and no guarding.  Genitourinary:  Breasts no masses skin changes or nipple changes bilaterally      Vulva is normal without lesions Vagina is pink moist without discharge Cervix normal in appearance and pap is not done Uterus is normal size shape and contour Adnexa is negative with normal sized ovaries  {Rectal    hemoccult negative, normal tone, no masses  Musculoskeletal: Normal range of motion. She exhibits no edema and no tenderness.  Neurological: She is alert and oriented to person, place, and time. She has normal reflexes. She displays normal reflexes. No cranial nerve deficit. She exhibits normal muscle tone. Coordination normal.  Skin: Skin is warm and dry. No rash noted. No erythema. No pallor.  Psychiatric: She has a normal mood and affect. Her behavior is normal. Judgment and thought content normal.       Medications Ordered at today's visit: No orders of the defined types were placed in this  encounter.   Other orders placed at today's visit: No orders of the defined types were placed in this encounter.     Assessment:    Normal Gyn exam.   pessary removed and cleaned Plan:    Mammogram ordered. Follow up in: 6 months.     No follow-ups on file.

## 2021-01-10 DIAGNOSIS — D225 Melanocytic nevi of trunk: Secondary | ICD-10-CM | POA: Diagnosis not present

## 2021-01-10 DIAGNOSIS — Z1283 Encounter for screening for malignant neoplasm of skin: Secondary | ICD-10-CM | POA: Diagnosis not present

## 2021-01-10 DIAGNOSIS — L820 Inflamed seborrheic keratosis: Secondary | ICD-10-CM | POA: Diagnosis not present

## 2021-03-01 DIAGNOSIS — N182 Chronic kidney disease, stage 2 (mild): Secondary | ICD-10-CM | POA: Diagnosis not present

## 2021-03-01 DIAGNOSIS — E782 Mixed hyperlipidemia: Secondary | ICD-10-CM | POA: Diagnosis not present

## 2021-03-01 DIAGNOSIS — I10 Essential (primary) hypertension: Secondary | ICD-10-CM | POA: Diagnosis not present

## 2021-03-01 DIAGNOSIS — E663 Overweight: Secondary | ICD-10-CM | POA: Diagnosis not present

## 2021-03-01 DIAGNOSIS — H1033 Unspecified acute conjunctivitis, bilateral: Secondary | ICD-10-CM | POA: Diagnosis not present

## 2021-03-01 DIAGNOSIS — I129 Hypertensive chronic kidney disease with stage 1 through stage 4 chronic kidney disease, or unspecified chronic kidney disease: Secondary | ICD-10-CM | POA: Diagnosis not present

## 2021-03-01 DIAGNOSIS — R809 Proteinuria, unspecified: Secondary | ICD-10-CM | POA: Diagnosis not present

## 2021-04-13 ENCOUNTER — Ambulatory Visit: Payer: BC Managed Care – PPO | Admitting: Adult Health

## 2021-04-13 ENCOUNTER — Encounter: Payer: Self-pay | Admitting: Adult Health

## 2021-04-13 ENCOUNTER — Other Ambulatory Visit: Payer: Self-pay

## 2021-04-13 VITALS — BP 117/74 | HR 74 | Ht 63.0 in | Wt 178.0 lb

## 2021-04-13 DIAGNOSIS — N814 Uterovaginal prolapse, unspecified: Secondary | ICD-10-CM | POA: Diagnosis not present

## 2021-04-13 DIAGNOSIS — Z4689 Encounter for fitting and adjustment of other specified devices: Secondary | ICD-10-CM

## 2021-04-13 NOTE — Progress Notes (Signed)
  Subjective:     Patient ID: Erica Fry, female   DOB: Apr 29, 1961, 60 y.o.   MRN: [id number]  HPI Erica Fry is a 60 year old white femael, married, PM in for pessary maintenance.No problems. PCP is Dr Margo Aye.  Review of Systems For pessary maintenance   Reviewed past medical,surgical, social and family history. Reviewed medications and allergies.     Objective:   Physical Exam BP 117/74 (BP Location: Right Arm, Patient Position: Sitting, Cuff Size: Normal)   Pulse 74   Ht 5\' 3"  (1.6 m)   Wt 178 lb (80.7 kg)   BMI 31.53 kg/m  Skin warm and dry.Pelvic: external genitalia is normal in appearance no lesions, vagina: pessary easily removed, and cleansed with soap and water and dried, has cystocele and relaxation, no lesions,urethra has no lesions or masses noted, cervix:smooth, uterus: normal size, shape and contour, non tender, no masses felt, adnexa: no masses or tenderness noted. Bladder is non tender and no masses felt. Pessary reinserted, milex ring with support   Examination chaperoned by [facility] LPN     Up[facility] - 04/13/21 1403      Pregnancy Intention Screening   Does the patient want to become pregnant in the next year? No    Does the patient's partner want to become pregnant in the next year? No    Would the patient like to discuss contraceptive options today? No      Contraception Wrap Up   Current Method Female Sterilization    End Method Female Sterilization    Contraception Counseling Provided No          Assessment:     1. Pessary maintenance  2. Cystocele with uterine prolapse     Plan:     Return in 6 months for pessary maintenance

## 2021-04-30 DIAGNOSIS — J019 Acute sinusitis, unspecified: Secondary | ICD-10-CM | POA: Diagnosis not present

## 2021-04-30 DIAGNOSIS — R809 Proteinuria, unspecified: Secondary | ICD-10-CM | POA: Diagnosis not present

## 2021-04-30 DIAGNOSIS — Z20828 Contact with and (suspected) exposure to other viral communicable diseases: Secondary | ICD-10-CM | POA: Diagnosis not present

## 2021-04-30 DIAGNOSIS — H1033 Unspecified acute conjunctivitis, bilateral: Secondary | ICD-10-CM | POA: Diagnosis not present

## 2021-04-30 DIAGNOSIS — R1010 Upper abdominal pain, unspecified: Secondary | ICD-10-CM | POA: Diagnosis not present

## 2021-06-19 ENCOUNTER — Other Ambulatory Visit (HOSPITAL_COMMUNITY): Payer: Self-pay | Admitting: Adult Health

## 2021-06-19 DIAGNOSIS — Z1231 Encounter for screening mammogram for malignant neoplasm of breast: Secondary | ICD-10-CM

## 2021-07-30 ENCOUNTER — Ambulatory Visit (HOSPITAL_COMMUNITY)
Admission: RE | Admit: 2021-07-30 | Discharge: 2021-07-30 | Disposition: A | Discharge status: Home / Self Care | Payer: BC Managed Care – PPO | Source: Ambulatory Visit | Attending: Adult Health | Admitting: Adult Health

## 2021-07-30 ENCOUNTER — Other Ambulatory Visit: Payer: Self-pay

## 2021-07-30 DIAGNOSIS — Z1231 Encounter for screening mammogram for malignant neoplasm of breast: Secondary | ICD-10-CM | POA: Diagnosis not present

## 2021-08-29 DIAGNOSIS — R7301 Impaired fasting glucose: Secondary | ICD-10-CM | POA: Diagnosis not present

## 2021-08-29 DIAGNOSIS — E039 Hypothyroidism, unspecified: Secondary | ICD-10-CM | POA: Diagnosis not present

## 2021-08-29 DIAGNOSIS — I10 Essential (primary) hypertension: Secondary | ICD-10-CM | POA: Diagnosis not present

## 2021-08-29 DIAGNOSIS — E559 Vitamin D deficiency, unspecified: Secondary | ICD-10-CM | POA: Diagnosis not present

## 2021-09-03 DIAGNOSIS — Z0001 Encounter for general adult medical examination with abnormal findings: Secondary | ICD-10-CM | POA: Diagnosis not present

## 2021-09-13 ENCOUNTER — Encounter: Payer: Self-pay | Admitting: Adult Health

## 2021-09-13 ENCOUNTER — Ambulatory Visit (INDEPENDENT_AMBULATORY_CARE_PROVIDER_SITE_OTHER): Payer: BC Managed Care – PPO | Admitting: Adult Health

## 2021-09-13 ENCOUNTER — Other Ambulatory Visit: Payer: Self-pay

## 2021-09-13 VITALS — BP 138/91 | HR 74 | Ht 63.0 in | Wt 172.0 lb

## 2021-09-13 DIAGNOSIS — N814 Uterovaginal prolapse, unspecified: Secondary | ICD-10-CM

## 2021-09-13 DIAGNOSIS — R102 Pelvic and perineal pain: Secondary | ICD-10-CM | POA: Diagnosis not present

## 2021-09-13 NOTE — Progress Notes (Signed)
  Subjective:     Patient ID: Erica Fry, female   DOB: 08-23-1961, 60 y.o.   MRN: [id number]  HPI Erica Fry is a 60 year old white female,married, PM, in complaining of pelvic pressure/pain for about 1.5 weeks, took pessary out and felt a little better. Has been fine other than had been riding  for hours a few days. PCP is Dr Margo Aye.  Review of Systems Pelvic pressure/pain Denies discharge,odor or bleeding, no problems with urination or BM Reviewed past medical,surgical, social and family history. Reviewed medications and allergies.     Objective:   Physical Exam BP (!) 138/91 (BP Location: Left Arm, Patient Position: Sitting, Cuff Size: Normal)   Pulse 74   Ht 5\' 3"  (1.6 m)   Wt 172 lb (78 kg)   BMI 30.47 kg/m     Skin warm and dry.Pelvic: external genitalia is normal in appearance no lesions, vagina: pale with loss of moisture and rugae,+cystocele, no irritation, discharge or odor,urethra has no lesions or masses noted, cervix:smooth and bulbous, uterus: normal size, shape and contour, mildly tender, no masses felt, +uterine descensus, adnexa: no masses, mildly tenderness noted. Bladder is non tender and no masses felt. Pessary replace and had pain and pressure increased so removed, cleaned and placed in bag and given back to pt.  Fall risk is low PHQ 2 score is 0  Upstream - 09/13/21 1345       Pregnancy Intention Screening   Does the patient want to become pregnant in the next year? N/A    Does the patient's partner want to become pregnant in the next year? N/A    Would the patient like to discuss contraceptive options today? N/A      Contraception Wrap Up   Current Method Female Sterilization    End Method Female Sterilization    Contraception Counseling Provided No            Examination chaperoned by 09/15/21.  Assessment:    1. Pelvic pain Will leave pessary out Engineer, structural scheduled for 09/21/21 at [facility] at 11:30 am - [facility] PELVIC COMPLETE WITH TRANSVAGINAL; Future  2.  Cystocele with uterine prolapse  - US PELVIC COMPLETE WITH TRANSVAGINAL; Future     Plan:     Follow up with me

## 2021-09-21 ENCOUNTER — Ambulatory Visit (HOSPITAL_COMMUNITY)
Admission: RE | Admit: 2021-09-21 | Discharge: 2021-09-21 | Disposition: A | Discharge status: Home / Self Care | Payer: BC Managed Care – PPO | Source: Ambulatory Visit | Attending: Adult Health | Admitting: Adult Health

## 2021-09-21 ENCOUNTER — Other Ambulatory Visit: Payer: Self-pay

## 2021-09-21 DIAGNOSIS — Z9889 Other specified postprocedural states: Secondary | ICD-10-CM | POA: Diagnosis not present

## 2021-09-21 DIAGNOSIS — R102 Pelvic and perineal pain: Secondary | ICD-10-CM | POA: Insufficient documentation

## 2021-09-21 DIAGNOSIS — N814 Uterovaginal prolapse, unspecified: Secondary | ICD-10-CM | POA: Diagnosis not present

## 2021-09-21 DIAGNOSIS — Z8742 Personal history of other diseases of the female genital tract: Secondary | ICD-10-CM | POA: Diagnosis not present

## 2021-09-27 ENCOUNTER — Ambulatory Visit: Payer: BC Managed Care – PPO | Admitting: Adult Health

## 2021-09-27 ENCOUNTER — Encounter: Payer: Self-pay | Admitting: Adult Health

## 2021-09-27 ENCOUNTER — Other Ambulatory Visit: Payer: Self-pay

## 2021-09-27 VITALS — BP 112/77 | HR 77 | Ht 63.0 in | Wt 172.6 lb

## 2021-09-27 DIAGNOSIS — N814 Uterovaginal prolapse, unspecified: Secondary | ICD-10-CM

## 2021-09-27 DIAGNOSIS — Z4689 Encounter for fitting and adjustment of other specified devices: Secondary | ICD-10-CM | POA: Diagnosis not present

## 2021-09-27 NOTE — Progress Notes (Signed)
  Subjective:     Patient ID: Erica Fry, female   DOB: 1961-05-15, 60 y.o.   MRN: [id number]  HPI Erica Fry is a 60 year old white female, married, PM, back in to see if about pessary fitting, tired the #4 ring yesterday and it hurt. PCP is Dr Margo Aye.  Review of Systems Has pelvic pressure with pessary,so has left out Reviewed past medical,surgical, social and family history. Reviewed medications and allergies.     Objective:   Physical Exam BP 112/77 (BP Location: Right Arm, Patient Position: Sitting, Cuff Size: Normal)   Pulse 77   Ht 5\' 3"  (1.6 m)   Wt 172 lb 9.6 oz (78.3 kg)   BMI 30.57 kg/m   Skin warm and dry, pessary out,+cystocele and uterine prolapse.   Dr in and did refit with #3 ring with support and she says it feels fine. Fall risk is low  Up[facility] - 09/27/21 1417       Pregnancy Intention Screening   Does the patient want to become pregnant in the next year? N/A    Does the patient's partner want to become pregnant in the next year? N/A    Would the patient like to discuss contraceptive options today? N/A      Contraception Wrap Up   Current Method Female Sterilization    End Method Female Sterilization    Contraception Counseling Provided No             Assessment:      1. Cystocele with uterine prolapse   2. Encounter for fitting and adjustment of pessary #3 ring with support     Plan:     Return 11/3 for physical with me

## 2021-10-17 ENCOUNTER — Encounter: Payer: Self-pay | Admitting: Adult Health

## 2021-10-17 ENCOUNTER — Ambulatory Visit (INDEPENDENT_AMBULATORY_CARE_PROVIDER_SITE_OTHER): Payer: BC Managed Care – PPO | Admitting: Adult Health

## 2021-10-17 ENCOUNTER — Other Ambulatory Visit: Payer: Self-pay

## 2021-10-17 VITALS — BP 107/79 | HR 70 | Ht 63.0 in | Wt 173.6 lb

## 2021-10-17 DIAGNOSIS — Z01419 Encounter for gynecological examination (general) (routine) without abnormal findings: Secondary | ICD-10-CM | POA: Diagnosis not present

## 2021-10-17 DIAGNOSIS — Z4689 Encounter for fitting and adjustment of other specified devices: Secondary | ICD-10-CM | POA: Diagnosis not present

## 2021-10-17 DIAGNOSIS — Z1211 Encounter for screening for malignant neoplasm of colon: Secondary | ICD-10-CM | POA: Diagnosis not present

## 2021-10-17 LAB — HEMOCCULT GUIAC POC 1CARD (OFFICE): Fecal Occult Blood, POC: NEGATIVE

## 2021-10-17 NOTE — Progress Notes (Signed)
Patient ID: Erica Fry, female   DOB: Jul 10, 1961, 60 y.o.   MRN: [id number] History of Present Illness: Erica Fry is a 60 year old white female,married, PM in for a well woman gyn exam. Lab Results  Component Value Date   DIAGPAP  10/14/2019    - Negative for intraepithelial lesion or malignancy (NILM)   HPVHIGH Negative 10/14/2019    PCP is Dr Margo Aye.  Current Medications, Allergies, Past Medical History, Past Surgical History, Family History and Social History were reviewed in [name] record.     Review of Systems: Patient denies any headaches, hearing loss, fatigue, blurred vision, shortness of breath, chest pain, abdominal pain, problems with bowel movements, urination, or intercourse. (Not sexually active). No joint pain or mood swings.  No more pain since down sized pessary.   Physical Exam:BP 107/79 (BP Location: Right Arm, Patient Position: Sitting, Cuff Size: Normal)   Pulse 70   Ht 5\' 3"  (1.6 m)   Wt 173 lb 9.6 oz (78.7 kg)   LMP  (LMP Unknown)   BMI 30.75 kg/m   General:  Well developed, well nourished, no acute distress Skin:  Warm and dry Neck:  Midline trachea, normal thyroid, good ROM, no lymphadenopathy Lungs; Clear to auscultation bilaterally Breast:  No dominant palpable mass, retraction, or nipple discharge Cardiovascular: Regular rate and rhythm Abdomen:  Soft, non tender, no hepatosplenomegaly Pelvic:  External genitalia is normal in appearance, no lesions.  The vagina is pale pink with out any lesions noted,+cystocele, after pessary easily removed, was washed with soap and water then dried and reinserted . Urethra has no lesions or masses. The cervix is smooth.  Uterus is felt to be normal size, shape, and contour.  No adnexal masses or tenderness noted.Bladder is non tender, no masses felt. Rectal: Good sphincter tone, no polyps, or hemorrhoids felt.  Hemoccult negative. Extremities/musculoskeletal:  No swelling or varicosities noted,  no clubbing or cyanosis Psych:  No mood changes, alert and cooperative,seems happy AA is 0 Fall risk is low Depression screen [facility] 2/9 10/17/2021 09/13/2021 10/16/2020  Decreased Interest 0 0 0  Down, Depressed, Hopeless 0 0 0  PHQ - 2 Score 0 0 0  Altered sleeping 1 - 1  Tired, decreased energy 1 - 1  Change in appetite 1 - 1  Feeling bad or failure about yourself  0 - 0  Trouble concentrating 0 - 1  Moving slowly or fidgety/restless 0 - 0  Suicidal thoughts 0 - 0  PHQ-9 Score 3 - 4    GAD 7 : Generalized Anxiety Score 10/17/2021 10/16/2020  Nervous, Anxious, on Edge 1 0  Control/stop worrying 1 0  Worry too much - different things 0 1  Trouble relaxing 0 0  Restless 0 0  Easily annoyed or irritable 0 0  Afraid - awful might happen 1 0  Total GAD 7 Score 3 1    Upstream - 10/17/21 13/02/22       Pregnancy Intention Screening   Does the patient want to become pregnant in the next year? N/A    Does the patient's partner want to become pregnant in the next year? N/A      Contraception Wrap Up   Current Method Female Sterilization    End Method Female Sterilization    Contraception Counseling Provided No            Examination chaperoned by [id number], NP student.Who assisted in co exam,with pt permission.     Impression  and Plan: 1. Encounter for well woman exam with routine gynecological exam Pap and physical in 1 year Labs with PCP Mammogram yearly Colonoscopy 05/2022  2. Encounter for screening fecal occult blood testing   3. Pessary maintenance Return in 6 months for pessary maintenance

## 2022-01-08 DIAGNOSIS — B078 Other viral warts: Secondary | ICD-10-CM | POA: Diagnosis not present

## 2022-01-08 DIAGNOSIS — Z1283 Encounter for screening for malignant neoplasm of skin: Secondary | ICD-10-CM | POA: Diagnosis not present

## 2022-01-08 DIAGNOSIS — D225 Melanocytic nevi of trunk: Secondary | ICD-10-CM | POA: Diagnosis not present

## 2022-02-28 DIAGNOSIS — E559 Vitamin D deficiency, unspecified: Secondary | ICD-10-CM | POA: Diagnosis not present

## 2022-02-28 DIAGNOSIS — I10 Essential (primary) hypertension: Secondary | ICD-10-CM | POA: Diagnosis not present

## 2022-02-28 DIAGNOSIS — E039 Hypothyroidism, unspecified: Secondary | ICD-10-CM | POA: Diagnosis not present

## 2022-02-28 DIAGNOSIS — R7301 Impaired fasting glucose: Secondary | ICD-10-CM | POA: Diagnosis not present

## 2022-03-04 ENCOUNTER — Ambulatory Visit: Payer: BC Managed Care – PPO | Admitting: Adult Health

## 2022-03-04 ENCOUNTER — Encounter: Payer: Self-pay | Admitting: Adult Health

## 2022-03-04 ENCOUNTER — Other Ambulatory Visit: Payer: Self-pay

## 2022-03-04 VITALS — BP 114/78 | HR 79 | Ht 63.0 in | Wt 171.0 lb

## 2022-03-04 DIAGNOSIS — Z4689 Encounter for fitting and adjustment of other specified devices: Secondary | ICD-10-CM

## 2022-03-04 DIAGNOSIS — R944 Abnormal results of kidney function studies: Secondary | ICD-10-CM | POA: Diagnosis not present

## 2022-03-04 DIAGNOSIS — E039 Hypothyroidism, unspecified: Secondary | ICD-10-CM | POA: Diagnosis not present

## 2022-03-04 DIAGNOSIS — N814 Uterovaginal prolapse, unspecified: Secondary | ICD-10-CM | POA: Diagnosis not present

## 2022-03-04 DIAGNOSIS — R7301 Impaired fasting glucose: Secondary | ICD-10-CM | POA: Diagnosis not present

## 2022-03-04 DIAGNOSIS — I10 Essential (primary) hypertension: Secondary | ICD-10-CM | POA: Diagnosis not present

## 2022-03-04 NOTE — Progress Notes (Signed)
?  Subjective:  ?  ? Patient ID: Erica Fry, female   DOB: 04/17/1961, 61 y.o.   MRN: [id number] ? ?HPI ?Erica Fry is a 61 year old white female, married, PM in for pessary maintenance. She has slight discharge, no bleeding.No odor. ?PCP is Dr Margo Aye. ?Lab Results  ?Component Value Date  ? DIAGPAP  10/14/2019  ?  - Negative for intraepithelial lesion or malignancy (NILM)  ? HPVHIGH Negative 10/14/2019  ?  ? ?Review of Systems ?+vaginal discharge,no odor ?Denies any bleeding ?Reviewed past medical,surgical, social and family history. Reviewed medications and allergies.  ?   ?Objective:  ? Physical Exam ?BP 114/78 (BP Location: Right Arm, Patient Position: Sitting, Cuff Size: Normal)   Pulse 79   Ht 5\' 3"  (1.6 m)   Wt 171 lb (77.6 kg)   BMI 30.29 kg/m?   ?  Skin warm and dry.Pelvic:  External genitalia is normal in appearance, no lesions.  The vagina is pale pink with out any lesions noted,+cystocele, after pessary, #3 ring with support,easily removed, was washed with soap and water then dried and reinserted . Urethra has small caruncle. The cervix is smooth.  Uterus is felt to be normal size, shape, and contour.  No adnexal masses or tenderness noted.Bladder is non tender, no masses  ?Examination chaperoned by LPN ?Reviewed labs done at PCP 02/28/22 will scan to chart. ? ?Assessment:  ?   ?1. Pessary maintenance ?Has #3 ring with support ? ?2. Cystocele with uterine prolapse ?   ?Plan:  ?   ?Follow up in 4 months for pessary maintenance  ?   ?

## 2022-03-05 ENCOUNTER — Encounter (INDEPENDENT_AMBULATORY_CARE_PROVIDER_SITE_OTHER): Payer: Self-pay | Admitting: *Deleted

## 2022-03-27 ENCOUNTER — Telehealth (INDEPENDENT_AMBULATORY_CARE_PROVIDER_SITE_OTHER): Payer: Self-pay | Admitting: *Deleted

## 2022-03-27 ENCOUNTER — Encounter (INDEPENDENT_AMBULATORY_CARE_PROVIDER_SITE_OTHER): Payer: Self-pay | Admitting: *Deleted

## 2022-03-27 NOTE — Telephone Encounter (Signed)
Patient needs trilyte 

## 2022-03-27 NOTE — Telephone Encounter (Signed)
Referring MD/PCP: hall ? ?Procedure: tcs ? ?Reason/Indication:  screening ? ?Has patient had this procedure before?  Yes, 2013 ? If so, when, by whom and where?   ? ?Is there a family history of colon cancer?  no ? Who?  What age when diagnosed?   ? ?Is patient diabetic? If yes, Type 1 or Type 2   no ?     ?Does patient have prosthetic heart valve or mechanical valve?  no ? ?Do you have a pacemaker/defibrillator?  no ? ?Has patient ever had endocarditis/atrial fibrillation? no ? ?Does patient use oxygen? no ? ?Has patient had joint replacement within last 12 months?  no ? ?Is patient constipated or do they take laxatives? no ? ?Does patient have a history of alcohol/drug use?  no ? ?Have you had a stroke/heart attack last 6 mths? no ? ?Do you take medicine for weight loss?  no ? ?For female patients,: have you had a hysterectomy no ?                     are you post menopausal yes ?                     do you still have your menstrual cycle no ? ?Is patient on blood thinner such as Coumadin, Plavix and/or Aspirin? yes ? ?Medications: asa 81 mg daily, levothyroxine 100 mcg daily, olmesartan/hctz 40/12.5 mg 1/2 tab bid, omeprazole 40 mg daily, pravastatin 40 mg daily, Vit D3 125 mcg daily, probiotic daily, Vit B12 2500 mcg daily, fexofenadine 180 mg daily ? ?Allergies: nkdas ? ?Medication Adjustment per Dr Rehman/Dr Levon Hedger asa 2 days ? ?Procedure date & time: 04/25/22 ? ? ?

## 2022-03-28 ENCOUNTER — Other Ambulatory Visit (INDEPENDENT_AMBULATORY_CARE_PROVIDER_SITE_OTHER): Payer: Self-pay

## 2022-03-28 DIAGNOSIS — Z1211 Encounter for screening for malignant neoplasm of colon: Secondary | ICD-10-CM

## 2022-03-28 DIAGNOSIS — I10 Essential (primary) hypertension: Secondary | ICD-10-CM

## 2022-03-28 MED ORDER — PEG 3350-KCL-NA BICARB-NACL 420 G PO SOLR: 4000.0000 mL | Freq: Once | ORAL | 0 refills | Status: AC

## 2022-04-22 ENCOUNTER — Other Ambulatory Visit (INDEPENDENT_AMBULATORY_CARE_PROVIDER_SITE_OTHER): Payer: Self-pay

## 2022-04-22 DIAGNOSIS — I10 Essential (primary) hypertension: Secondary | ICD-10-CM

## 2022-04-23 ENCOUNTER — Other Ambulatory Visit (INDEPENDENT_AMBULATORY_CARE_PROVIDER_SITE_OTHER): Payer: Self-pay

## 2022-04-23 ENCOUNTER — Other Ambulatory Visit (HOSPITAL_COMMUNITY)
Admission: RE | Admit: 2022-04-23 | Discharge: 2022-04-23 | Disposition: A | Discharge status: Home / Self Care | Payer: BC Managed Care – PPO | Source: Ambulatory Visit | Attending: Internal Medicine | Admitting: Internal Medicine

## 2022-04-23 DIAGNOSIS — I10 Essential (primary) hypertension: Secondary | ICD-10-CM

## 2022-04-23 DIAGNOSIS — K5730 Diverticulosis of large intestine without perforation or abscess without bleeding: Secondary | ICD-10-CM | POA: Diagnosis not present

## 2022-04-23 DIAGNOSIS — Z1211 Encounter for screening for malignant neoplasm of colon: Secondary | ICD-10-CM | POA: Diagnosis not present

## 2022-04-23 DIAGNOSIS — K644 Residual hemorrhoidal skin tags: Secondary | ICD-10-CM | POA: Diagnosis not present

## 2022-04-23 DIAGNOSIS — Z7982 Long term (current) use of aspirin: Secondary | ICD-10-CM | POA: Diagnosis not present

## 2022-04-23 DIAGNOSIS — K648 Other hemorrhoids: Secondary | ICD-10-CM | POA: Diagnosis not present

## 2022-04-23 DIAGNOSIS — E039 Hypothyroidism, unspecified: Secondary | ICD-10-CM | POA: Diagnosis not present

## 2022-04-23 DIAGNOSIS — K219 Gastro-esophageal reflux disease without esophagitis: Secondary | ICD-10-CM | POA: Diagnosis not present

## 2022-04-23 DIAGNOSIS — K449 Diaphragmatic hernia without obstruction or gangrene: Secondary | ICD-10-CM | POA: Diagnosis not present

## 2022-04-23 LAB — BASIC METABOLIC PANEL
Anion gap: 7 (ref 5–15)
BUN: 11 mg/dL (ref 6–20)
CO2: 27 mmol/L (ref 22–32)
Calcium: 9.3 mg/dL (ref 8.9–10.3)
Chloride: 103 mmol/L (ref 98–111)
Creatinine, Ser: 0.85 mg/dL (ref 0.44–1.00)
GFR, Estimated: >60 mL/min (ref >60)
Glucose, Bld: 118 mg/dL — ABNORMAL HIGH (ref 70–99)
Potassium: 3.9 mmol/L (ref 3.5–5.1)
Sodium: 137 mmol/L (ref 135–145)

## 2022-04-25 ENCOUNTER — Ambulatory Visit (HOSPITAL_COMMUNITY): Payer: BC Managed Care – PPO | Admitting: Anesthesiology

## 2022-04-25 ENCOUNTER — Other Ambulatory Visit: Payer: Self-pay

## 2022-04-25 ENCOUNTER — Ambulatory Visit (HOSPITAL_COMMUNITY)
Admission: RE | Admit: 2022-04-25 | Discharge: 2022-04-25 | Disposition: A | Discharge status: Home / Self Care | Payer: BC Managed Care – PPO | Attending: Internal Medicine | Admitting: Internal Medicine

## 2022-04-25 ENCOUNTER — Encounter (HOSPITAL_COMMUNITY): Payer: Self-pay | Admitting: Internal Medicine

## 2022-04-25 ENCOUNTER — Encounter (HOSPITAL_COMMUNITY)
Admission: RE | Disposition: A | Discharge status: Home / Self Care | Payer: Self-pay | Source: Home / Self Care | Attending: Internal Medicine

## 2022-04-25 DIAGNOSIS — K644 Residual hemorrhoidal skin tags: Secondary | ICD-10-CM | POA: Diagnosis not present

## 2022-04-25 DIAGNOSIS — K5730 Diverticulosis of large intestine without perforation or abscess without bleeding: Secondary | ICD-10-CM | POA: Diagnosis not present

## 2022-04-25 DIAGNOSIS — Z1211 Encounter for screening for malignant neoplasm of colon: Secondary | ICD-10-CM | POA: Insufficient documentation

## 2022-04-25 DIAGNOSIS — K449 Diaphragmatic hernia without obstruction or gangrene: Secondary | ICD-10-CM | POA: Insufficient documentation

## 2022-04-25 DIAGNOSIS — K648 Other hemorrhoids: Secondary | ICD-10-CM | POA: Diagnosis not present

## 2022-04-25 DIAGNOSIS — E039 Hypothyroidism, unspecified: Secondary | ICD-10-CM | POA: Diagnosis not present

## 2022-04-25 DIAGNOSIS — K219 Gastro-esophageal reflux disease without esophagitis: Secondary | ICD-10-CM | POA: Insufficient documentation

## 2022-04-25 DIAGNOSIS — Z7982 Long term (current) use of aspirin: Secondary | ICD-10-CM | POA: Diagnosis not present

## 2022-04-25 DIAGNOSIS — I10 Essential (primary) hypertension: Secondary | ICD-10-CM | POA: Insufficient documentation

## 2022-04-25 HISTORY — DX: Gastro-esophageal reflux disease without esophagitis: K21.9

## 2022-04-25 HISTORY — DX: Hypothyroidism, unspecified: E03.9

## 2022-04-25 HISTORY — PX: COLONOSCOPY WITH PROPOFOL: SHX5780

## 2022-04-25 LAB — HM COLONOSCOPY

## 2022-04-25 SURGERY — COLONOSCOPY WITH PROPOFOL
Anesthesia: General

## 2022-04-25 MED ORDER — LACTATED RINGERS IV SOLN: INTRAVENOUS | Status: DC

## 2022-04-25 MED ORDER — PROPOFOL 10 MG/ML IV BOLUS
INTRAVENOUS | Status: DC | PRN
Start: 2022-04-25 — End: 2022-04-25
  Administered 2022-04-25: 50 mg via INTRAVENOUS
  Administered 2022-04-25: 100 mg via INTRAVENOUS

## 2022-04-25 MED ORDER — STERILE WATER FOR IRRIGATION IR SOLN
Status: DC | PRN
  Administered 2022-04-25: 50 mL

## 2022-04-25 MED ORDER — PROPOFOL 500 MG/50ML IV EMUL
INTRAVENOUS | Status: DC | PRN
  Administered 2022-04-25: 125 ug/kg/min via INTRAVENOUS

## 2022-04-25 MED ORDER — LIDOCAINE HCL (CARDIAC) PF 50 MG/5ML IV SOSY
PREFILLED_SYRINGE | INTRAVENOUS | Status: DC | PRN
  Administered 2022-04-25: 50 mg via INTRAVENOUS

## 2022-04-25 NOTE — Anesthesia Preprocedure Evaluation (Signed)
Anesthesia Evaluation  ?Patient identified by MRN, date of birth, ID band ?Patient awake ? ? ? ?Reviewed: ?Allergy & Precautions, H&P , NPO status , Patient's Chart, lab work & pertinent test results, reviewed documented beta blocker date and time  ? ?Airway ?Mallampati: II ? ?TM Distance: >3 FB ?Neck ROM: full ? ? ? Dental ?no notable dental hx. ? ?  ?Pulmonary ?neg pulmonary ROS,  ?  ?Pulmonary exam normal ?breath sounds clear to auscultation ? ? ? ? ? ? Cardiovascular ?Exercise Tolerance: Good ?hypertension, negative cardio ROS ? ? ?Rhythm:regular Rate:Normal ? ? ?  ?Neuro/Psych ?negative neurological ROS ? negative psych ROS  ? GI/Hepatic ?Neg liver ROS, hiatal hernia, GERD  Medicated,  ?Endo/Other  ?Hypothyroidism  ? Renal/GU ?negative Renal ROS  ?negative genitourinary ?  ?Musculoskeletal ? ? Abdominal ?  ?Peds ? Hematology ?negative hematology ROS ?(+)   ?Anesthesia Other Findings ? ? Reproductive/Obstetrics ?negative OB ROS ? ?  ? ? ? ? ? ? ? ? ? ? ? ? ? ?  ?  ? ? ? ? ? ? ? ? ?Anesthesia Physical ?Anesthesia Plan ? ?ASA: 2 ? ?Anesthesia Plan: General  ? ?Post-op Pain Management:   ? ?Induction:  ? ?PONV Risk Score and Plan: Propofol infusion ? ?Airway Management Planned:  ? ?Additional Equipment:  ? ?Intra-op Plan:  ? ?Post-operative Plan:  ? ?Informed Consent: I have reviewed the patients History and Physical, chart, labs and discussed the procedure including the risks, benefits and alternatives for the proposed anesthesia with the patient or authorized representative who has indicated his/her understanding and acceptance.  ? ? ? ?Dental Advisory Given ? ?Plan Discussed with: CRNA ? ?Anesthesia Plan Comments:   ? ? ? ? ? ? ?Anesthesia Quick Evaluation ? ?

## 2022-04-25 NOTE — Discharge Instructions (Addendum)
Resume usual medications including aspirin as before. ?High-fiber diet. ?No driving for 24 hours. ?Next screening exam in 10 years. ?

## 2022-04-25 NOTE — Anesthesia Procedure Notes (Signed)
Date/Time: 04/25/2022 12:16 PM ?Performed by: Franco Nones, CRNA ?Pre-anesthesia Checklist: Patient identified, Emergency Drugs available, Suction available, Timeout performed and Patient being monitored ?Patient Re-evaluated:Patient Re-evaluated prior to induction ?Oxygen Delivery Method: Nasal Cannula ? ? ? ? ?

## 2022-04-25 NOTE — H&P (Signed)
Erica Fry is an 61 y.o. female.   ?Chief Complaint: Patient is here for colonoscopy. ?HPI: Patient is 61 year old Caucasian female who is here for screening colonoscopy.  Last exam was in April 2013 revealing single sigmoid diverticula and external hemorrhoids.  She denies abdominal pain change in bowel habits or rectal bleeding. ?Family history is negative for colon cancer. ?Last aspirin dose was 3 days ago. ? ?Past Medical History:  ?Diagnosis Date  ? Abnormal Pap smear   ? Cystocele 08/24/2013  ? GERD (gastroesophageal reflux disease)   ? H/O hiatal hernia   ? Hot flashes 09/05/2015  ? Hypercholesteremia   ? Hypertension   ? Hypothyroidism   ? Thyroid disease   ? Vaginal Pap smear, abnormal   ? ? ?Past Surgical History:  ?Procedure Laterality Date  ? CHOLECYSTECTOMY    ? COLONOSCOPY  05/20/2012  ? Procedure: COLONOSCOPY;  Surgeon: Malissa Hippo, MD;  Location: [facility];  Service: Endoscopy;  Laterality: N/A;  830  ? ENDOMETRIAL ABLATION    ? FOOT BONE EXCISION    ? right  ? TONSILLECTOMY    ? TUBAL LIGATION    ? ? ?Family History  ?Problem Relation Age of Onset  ? Diabetes Mother   ? Stroke Mother   ? Hypertension Mother   ? Congestive Heart Failure Mother   ? Hypertension Father   ? Cancer Father   ?     prostate  ? Diabetes Father   ? Cancer Brother   ?     prostate  ? Diabetes Maternal Grandmother   ? Stroke Maternal Grandmother   ? Congestive Heart Failure Maternal Grandmother   ? Emphysema Maternal Grandfather   ? Diabetes Paternal Grandmother   ? Hypertension Paternal Grandmother   ? Stroke Paternal Grandmother   ? Mental illness Paternal Grandfather   ?     commited suicide  ? Thyroid disease Daughter   ? Cancer Paternal Aunt   ?     breast  ? Other Paternal Aunt   ?     polio  ? Cancer Maternal Aunt   ?     lung  ? ?Social History:  reports that she has never smoked. She has never used smokeless tobacco. She reports that she does not drink alcohol and does not use drugs. ? ?Allergies: No Known  Allergies ? ?Medications Prior to Admission  ?Medication Sig Dispense Refill  ? aspirin 81 MG tablet Take 81 mg by mouth every evening.     ? atorvastatin (LIPITOR) 10 MG tablet Take 10 mg by mouth every evening.    ? cholecalciferol (VITAMIN D3) 25 MCG (1000 UNIT) tablet Take 1,000 Units by mouth daily.    ? Cyanocobalamin (B-12) 2500 MCG TABS Take 2,500 mcg by mouth daily.    ? fexofenadine (ALLEGRA) 180 MG tablet Take 180 mg by mouth at bedtime.    ? levothyroxine (SYNTHROID, LEVOTHROID) 100 MCG tablet Take 100 mcg by mouth daily before breakfast.   4  ? olmesartan-hydrochlorothiazide (BENICAR HCT) 40-12.5 MG tablet Take 1 tablet by mouth daily.    ? omeprazole (PRILOSEC) 40 MG capsule Take 40 mg by mouth daily.    ? meclizine (ANTIVERT) 25 MG tablet Take 25 mg by mouth 3 (three) times daily as needed for dizziness.    ? Probiotic Product (PROBIOTIC DAILY PO) Take 1 capsule by mouth daily. (PB8)    ? ? ?Results for orders placed or performed during the hospital encounter of 04/23/22 (from  the past 48 hour(s))  ?Basic metabolic panel     Status: Abnormal  ? Collection Time: 04/23/22  2:43 PM  ?Result Value Ref Range  ? Sodium 137 135 - 145 mmol/L  ? Potassium 3.9 3.5 - 5.1 mmol/L  ? Chloride 103 98 - 111 mmol/L  ? CO2 27 22 - 32 mmol/L  ? Glucose, Bld 118 (H) 70 - 99 mg/dL  ?  Comment: Glucose reference range applies only to samples taken after fasting for at least 8 hours.  ? BUN 11 6 - 20 mg/dL  ? Creatinine, Ser 0.85 0.44 - 1.00 mg/dL  ? Calcium 9.3 8.9 - 10.3 mg/dL  ? GFR, Estimated >60 >60 mL/min  ?  Comment: (NOTE) ?Calculated using the CKD-EPI Creatinine Equation (2021) ?  ? Anion gap 7 5 - 15  ?  Comment: Performed at [facility], [address], [place], [place] [address]  ? ?No results found. ? ?Review of Systems ? ?Blood pressure 122/83, pulse 81, temperature 97.6 ?F (36.4 ?C), temperature source Oral, resp. rate 13, height 5\' 4"  (1.626 m), weight 72.6 kg, SpO2 100 %. ?Physical Exam ?HENT:  ?    Mouth/Throat:  ?   Mouth: Mucous membranes are moist.  ?   Pharynx: Oropharynx is clear.  ?Eyes:  ?   General: No scleral icterus. ?   Conjunctiva/sclera: Conjunctivae normal.  ?Cardiovascular:  ?   Rate and Rhythm: Normal rate and regular rhythm.  ?   Heart sounds: Normal heart sounds. No murmur heard. ?Pulmonary:  ?   Effort: Pulmonary effort is normal.  ?   Breath sounds: Normal breath sounds.  ?Abdominal:  ?   General: There is no distension.  ?   Palpations: Abdomen is soft. There is no mass.  ?   Tenderness: There is no abdominal tenderness.  ?Musculoskeletal:     ?   General: No swelling.  ?   Cervical back: Neck supple.  ?Lymphadenopathy:  ?   Cervical: No cervical adenopathy.  ?Skin: ?   General: Skin is warm and dry.  ?Neurological:  ?   Mental Status: She is alert.  ?  ? ?Assessment/Plan ? ?Average risk screening colonoscopy. ? ? , MD ?04/25/2022, 12:09 PM ? ? ? ?

## 2022-04-25 NOTE — Op Note (Signed)
[facility] ?Patient Name: Erica Fry ?Procedure Date: 04/25/2022 11:37 AM ?MRN: [id number] ?Date of Birth: July 05, 1961 ?Attending MD: Hildred Laser , MD ?CSN: [id number] ?Age: 61 ?Admit Type: Outpatient ?Procedure:                Colonoscopy ?Indications:              Screening for colorectal malignant neoplasm ?Providers:                Hildred Laser, MD, Charlsie Quest Theda Sers RN, Therapist, sports,  ?                          Kristine L. Risa Grill, Technician ?Referring MD:             Delphina Cahill, MD ?Medicines:                Propofol per Anesthesia ?Complications:            No immediate complications. ?Estimated Blood Loss:     Estimated blood loss: none. ?Procedure:                Pre-Anesthesia Assessment: ?                          - Prior to the procedure, a History and Physical  ?                          was performed, and patient medications and  ?                          allergies were reviewed. The patient's tolerance of  ?                          previous anesthesia was also reviewed. The risks  ?                          and benefits of the procedure and the sedation  ?                          options and risks were discussed with the patient.  ?                          All questions were answered, and informed consent  ?                          was obtained. Prior Anticoagulants: The patient has  ?                          taken no previous anticoagulant or antiplatelet  ?                          agents except for aspirin. ASA Grade Assessment: II  ?                          - A patient with mild systemic disease. After  ?  reviewing the risks and benefits, the patient was  ?                          deemed in satisfactory condition to undergo the  ?                          procedure. ?                          After obtaining informed consent, the colonoscope  ?                          was passed under direct vision. Throughout the  ?                          procedure, the  patient's blood pressure, pulse, and  ?                          oxygen saturations were monitored continuously. The  ?                          PCF-[id number]) scope was introduced through  ?                          the anus and advanced to the the cecum, identified  ?                          by appendiceal orifice and ileocecal valve. The  ?                          colonoscopy was performed without difficulty. The  ?                          patient tolerated the procedure well. The quality  ?                          of the bowel preparation was excellent. The  ?                          ileocecal valve, appendiceal orifice, and rectum  ?                          were photographed. ?Scope In: 12:22:06 PM ?Scope Out: 12:37:24 PM ?Scope Withdrawal Time: 0 hours 9 minutes 18 seconds  ?Total Procedure Duration: 0 hours 15 minutes 18 seconds  ?Findings: ?     The perianal and digital rectal examinations were normal. ?     A few diverticula were found in the sigmoid colon. ?     The exam was otherwise normal throughout the examined colon. ?     External and internal hemorrhoids were found during retroflexion. The  ?     hemorrhoids were small. ?Impression:               - Diverticulosis in the sigmoid colon. ?                          -  External and internal hemorrhoids. ?                          - No specimens collected. ?Moderate Sedation: ?     Per Anesthesia Care ?Recommendation:           - Patient has a contact number available for  ?                          emergencies. The signs and symptoms of potential  ?                          delayed complications were discussed with the  ?                          patient. Return to normal activities tomorrow.  ?                          Written discharge instructions were provided to the  ?                          patient. ?                          - High fiber diet today. ?                          - Continue present medications. ?                           - Repeat colonoscopy in 10 years for screening  ?                          purposes. ?Procedure Code(s):        --- Professional --- ?                          [phone], Colonoscopy, flexible; diagnostic, including  ?                          collection of specimen(s) by brushing or washing,  ?                          when performed (separate procedure) ?Diagnosis Code(s):        --- Professional --- ?                          Z12.11, Encounter for screening for malignant  ?                          neoplasm of colon ?                          K64.8, Other hemorrhoids ?                          K57.30, Diverticulosis of large intestine without  ?  perforation or abscess without bleeding ?CPT copyright 2019 American Medical Association. All rights reserved. ?The codes documented in this report are preliminary and upon coder review may  ?be revised to meet current compliance requirements. ?Hildred Laser, MD ?Hildred Laser, MD ?04/25/2022 12:45:27 PM ?This report has been signed electronically. ?Number of Addenda: 0 ?

## 2022-04-25 NOTE — Transfer of Care (Signed)
Immediate Anesthesia Transfer of Care Note ? ?Patient: SHERITTA DEEG ? ?Procedure(s) Performed: COLONOSCOPY WITH PROPOFOL ? ?Patient Location: Endoscopy Unit ? ?Anesthesia Type:General ? ?Level of Consciousness: awake and patient cooperative ? ?Airway & Oxygen Therapy: Patient Spontanous Breathing ? ?Post-op Assessment: Report given to RN and Post -op Vital signs reviewed and stable ? ?Post vital signs: Reviewed and stable ? ?Last Vitals:  ?Vitals Value Taken Time  ?BP 92/57 04/25/22 1242  ?Temp 36.5 ?C 04/25/22 1242  ?Pulse 74   ?Resp 15 04/25/22 1242  ?SpO2 98 % 04/25/22 1242  ? ? ?Last Pain:  ?Vitals:  ? 04/25/22 1242  ?TempSrc: Oral  ?PainSc: 0-No pain  ?   ? ?Patients Stated Pain Goal: 9 (04/25/22 1105) ? ?Complications: No notable events documented. ?

## 2022-04-26 ENCOUNTER — Encounter (INDEPENDENT_AMBULATORY_CARE_PROVIDER_SITE_OTHER): Payer: Self-pay | Admitting: *Deleted

## 2022-04-26 NOTE — Anesthesia Postprocedure Evaluation (Signed)
Anesthesia Post Note ? ?Patient: FRANCINE HANNAN ? ?Procedure(s) Performed: COLONOSCOPY WITH PROPOFOL ? ?Patient location during evaluation: Phase II ?Anesthesia Type: General ?Level of consciousness: awake ?Pain management: pain level controlled ?Vital Signs Assessment: post-procedure vital signs reviewed and stable ?Respiratory status: spontaneous breathing and respiratory function stable ?Cardiovascular status: blood pressure returned to baseline and stable ?Postop Assessment: no headache and no apparent nausea or vomiting ?Anesthetic complications: no ?Comments: Late entry ? ? ?No notable events documented. ? ? ?Last Vitals:  ?Vitals:  ? 04/25/22 1105 04/25/22 1242  ?BP: 122/83 (!) 92/57  ?Pulse: 81   ?Resp: 13 15  ?Temp: 36.4 ?C 36.5 ?C  ?SpO2: 100% 98%  ?  ?Last Pain:  ?Vitals:  ? 04/25/22 1242  ?TempSrc: Oral  ?PainSc: 0-No pain  ? ? ?  ?  ?  ?  ?  ?  ? ?Windell [place] ? ? ? ? ?

## 2022-05-02 ENCOUNTER — Encounter (HOSPITAL_COMMUNITY): Payer: Self-pay | Admitting: Internal Medicine

## 2022-07-04 ENCOUNTER — Ambulatory Visit: Payer: BC Managed Care – PPO | Admitting: Adult Health

## 2022-07-25 ENCOUNTER — Encounter: Payer: Self-pay | Admitting: Adult Health

## 2022-07-25 ENCOUNTER — Ambulatory Visit: Payer: BC Managed Care – PPO | Admitting: Adult Health

## 2022-07-25 VITALS — BP 118/80 | HR 76 | Ht 63.0 in | Wt 167.0 lb

## 2022-07-25 DIAGNOSIS — Z4689 Encounter for fitting and adjustment of other specified devices: Secondary | ICD-10-CM | POA: Diagnosis not present

## 2022-07-25 DIAGNOSIS — N814 Uterovaginal prolapse, unspecified: Secondary | ICD-10-CM

## 2022-07-25 NOTE — Progress Notes (Signed)
  Subjective:     Patient ID: Erica Fry, female   DOB: Dec 11, 1961, 61 y.o.   MRN: [id number]  HPI Erica Fry is a 61 year old white female, married, PM in for pessary maintenance Lab Results  Component Value Date   DIAGPAP  10/14/2019    - Negative for intraepithelial lesion or malignancy (NILM)   HPVHIGH Negative 10/14/2019    Review of Systems For pessary maintenance Denies any bleeding or discharge Reviewed past medical,surgical, social and family history. Reviewed medications and allergies.     Objective:   Physical Exam BP 118/80 (BP Location: Right Arm, Patient Position: Sitting, Cuff Size: Normal)   Pulse 76   Ht 5\' 3"  (1.6 m)   Wt 167 lb (75.8 kg)   BMI 29.58 kg/m      Skin warm and dry.Pelvic:  External genitalia is normal in appearance, no lesions.  The vagina is pale pink with out any lesions noted,+cystocele, after pessary, #3 ring with support,easily removed, was washed with soap and water then dried and reinserted . Urethra has small caruncle. The cervix is smooth.  Uterus is felt to be normal size, shape, and contour.  No adnexal masses or tenderness noted.Bladder is non tender, no masses  Examination chaperoned by LPN Assessment:     1. Pessary maintenance Has #3 ring with support  2. Cystocele with uterine prolapse     Plan:     Return in 3 months for pap and physical and pessary maintenance with me

## 2022-08-11 IMAGING — US US PELVIS COMPLETE WITH TRANSVAGINAL
1 series · 14 of 25 positions shown · non-contrast
Comparison: 05/07/2018

CLINICAL DATA: Pelvic pain for 1.5 weeks, history of uterine
prolapse, cystocele, endometrial ablation, tubal ligation,
postmenopausal, removed pessary prior to ultrasound



[Series 1: us pelvic complete with transvaginal · 14 of 66 slices shown]
[im 1/66]
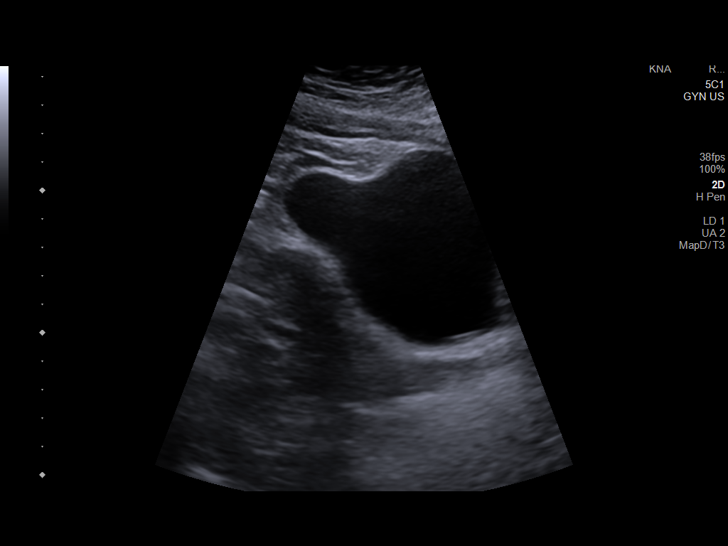
[im 6/66]
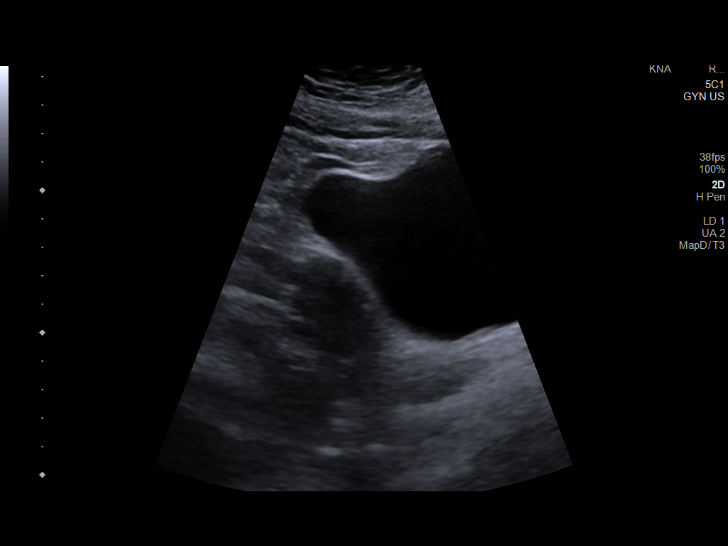
[im 11/66]
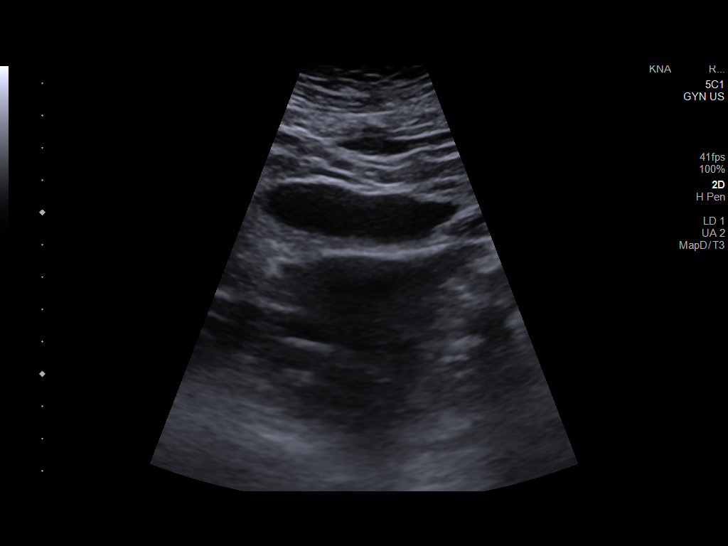
[im 17/66]
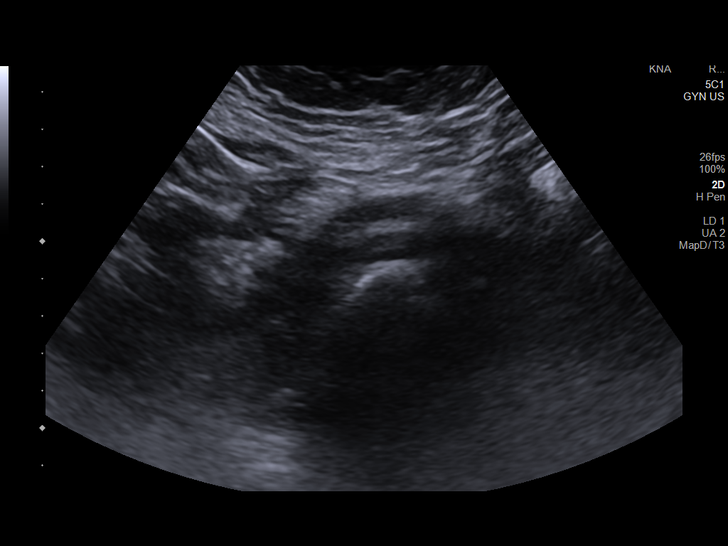
[im 22/66]
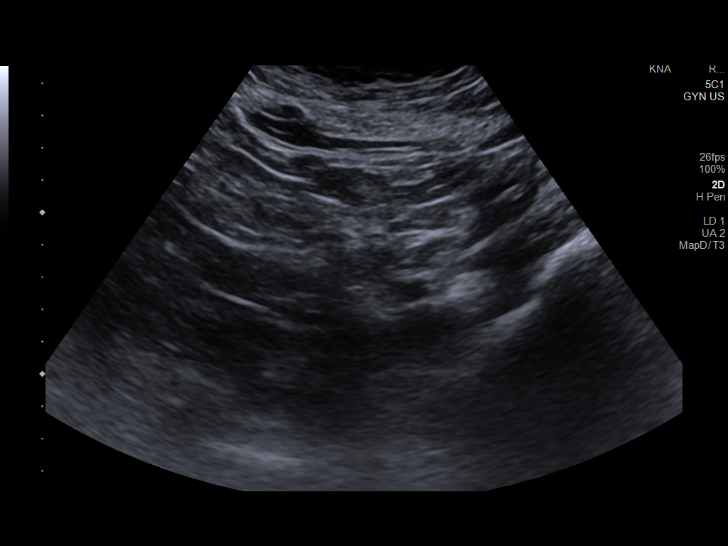
[im 25/66]
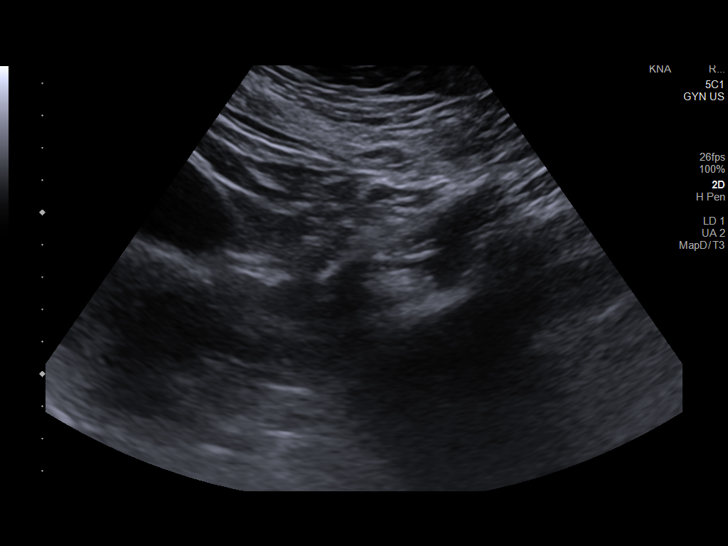
[im 30/66]
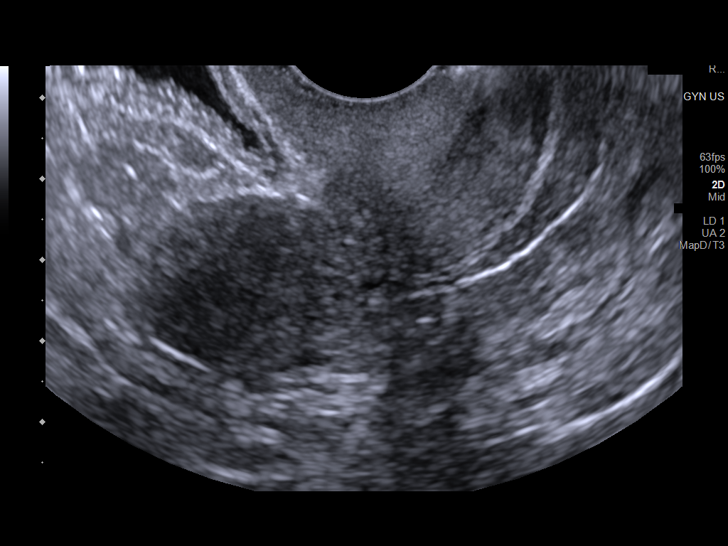
[im 36/66]
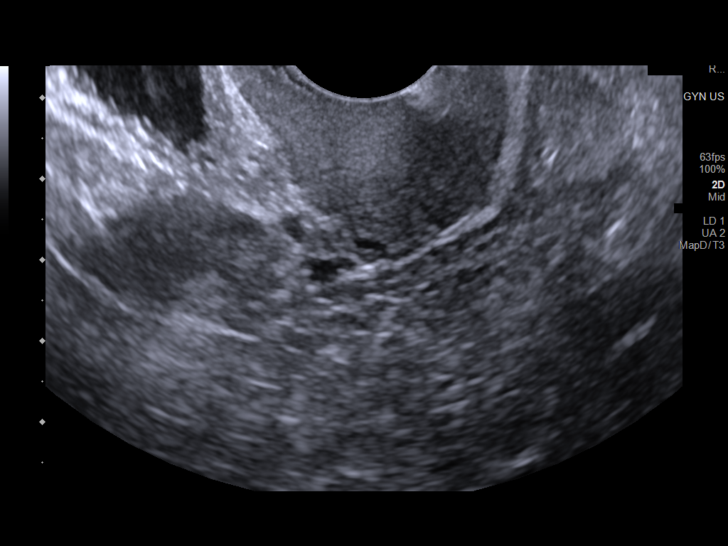
[im 41/66]
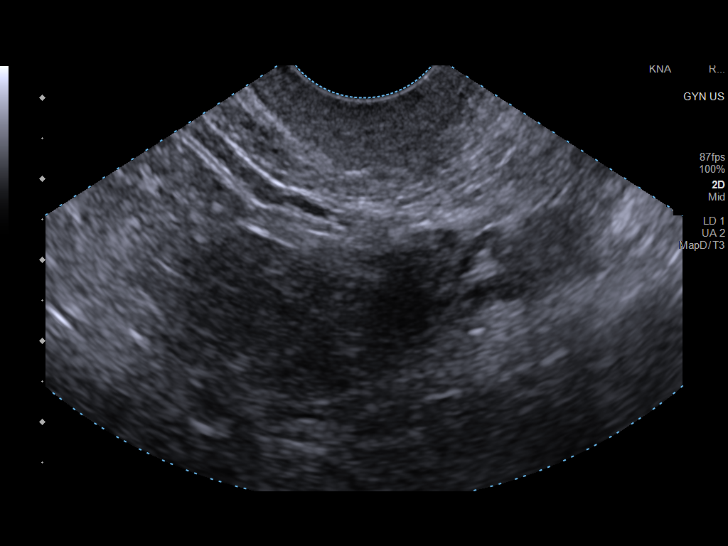
[im 44/66]
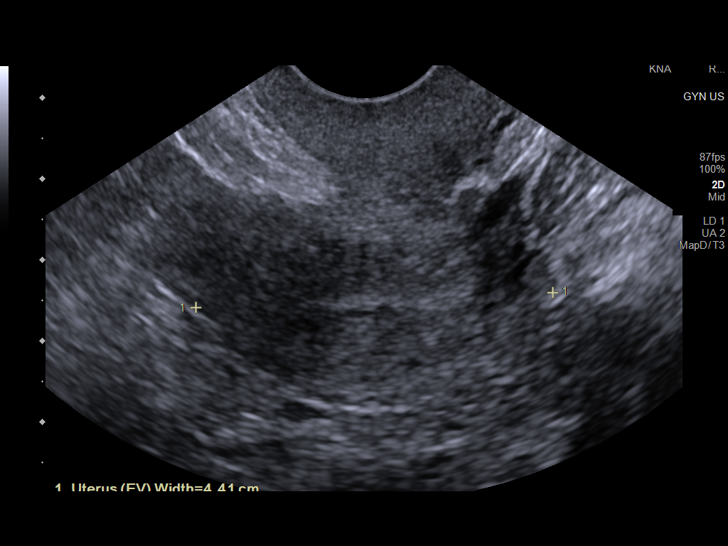
[im 49/66]
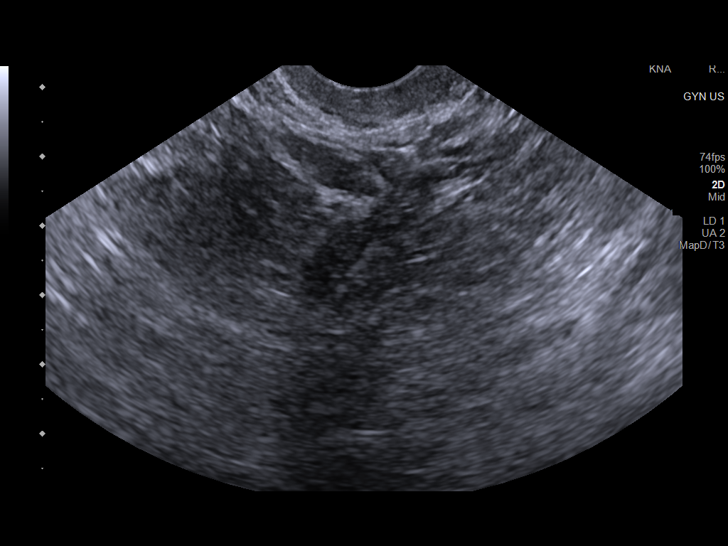
[im 55/66]
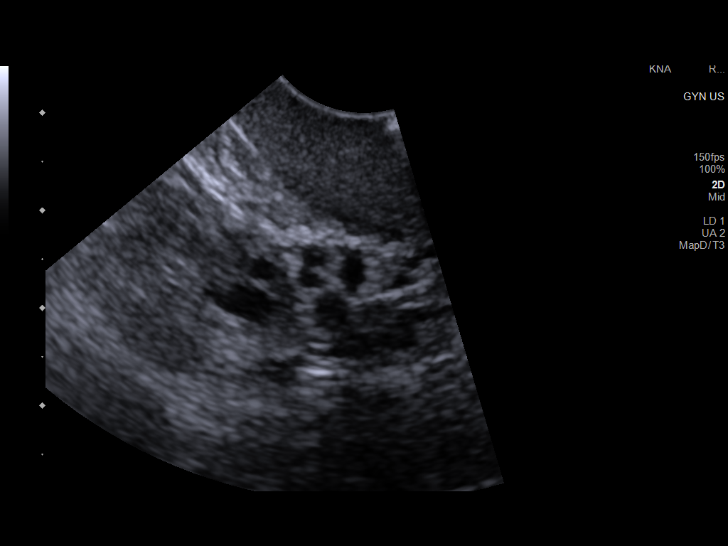
[im 60/66]
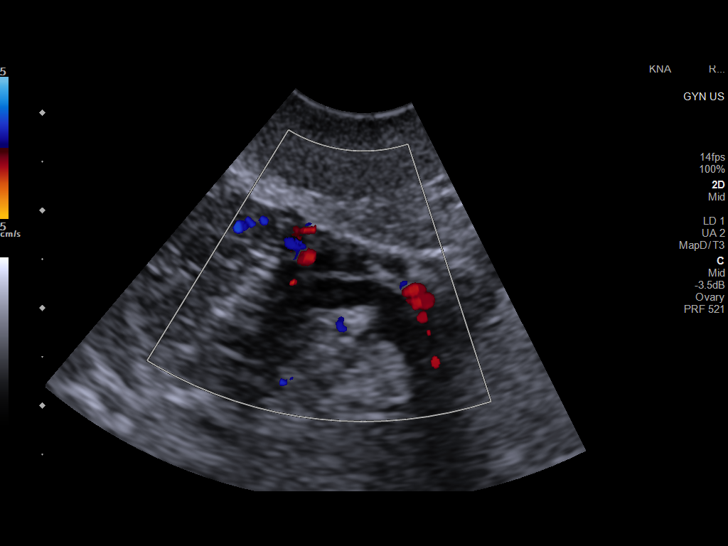
[im 66/66]
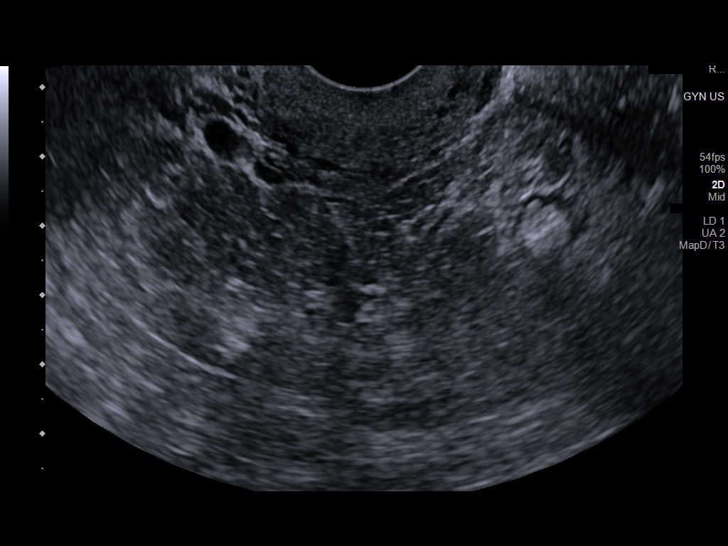

[14 of 25 positions shown; findings below may reference images not displayed]

FINDINGS: Uterus

Measurements: 6.3 x 2.7 x 4.5 cm = volume: 41 mL. Anteverted.
Heterogeneous myometrium. No definite uterine masses.

Endometrium

Thickness: 3 mm.  No endometrial fluid or mass

Right ovary

Not visualized, likely obscured by bowel

Left ovary

Not definitely visualized, obscured by bowel

Other findings

No free pelvic fluid.  No adnexal masses.
IMPRESSION: Nonvisualization of ovaries.

No definite pelvic sonographic abnormalities.

## 2022-08-26 ENCOUNTER — Other Ambulatory Visit (HOSPITAL_COMMUNITY): Payer: Self-pay | Admitting: Adult Health

## 2022-08-26 DIAGNOSIS — Z1231 Encounter for screening mammogram for malignant neoplasm of breast: Secondary | ICD-10-CM

## 2022-08-30 DIAGNOSIS — E039 Hypothyroidism, unspecified: Secondary | ICD-10-CM | POA: Diagnosis not present

## 2022-08-30 DIAGNOSIS — E782 Mixed hyperlipidemia: Secondary | ICD-10-CM | POA: Diagnosis not present

## 2022-08-30 DIAGNOSIS — E559 Vitamin D deficiency, unspecified: Secondary | ICD-10-CM | POA: Diagnosis not present

## 2022-09-06 ENCOUNTER — Ambulatory Visit (HOSPITAL_COMMUNITY)
Admission: RE | Admit: 2022-09-06 | Discharge: 2022-09-06 | Disposition: A | Discharge status: Home / Self Care | Payer: BC Managed Care – PPO | Source: Ambulatory Visit | Attending: Adult Health | Admitting: Adult Health

## 2022-09-06 DIAGNOSIS — Z1231 Encounter for screening mammogram for malignant neoplasm of breast: Secondary | ICD-10-CM | POA: Diagnosis not present

## 2022-09-06 DIAGNOSIS — Z0001 Encounter for general adult medical examination with abnormal findings: Secondary | ICD-10-CM | POA: Diagnosis not present

## 2022-09-16 ENCOUNTER — Emergency Department (HOSPITAL_COMMUNITY)
Admission: EM | Admit: 2022-09-16 | Discharge: 2022-09-16 | Discharge status: Against Medical Advice | Payer: BC Managed Care – PPO | Attending: Emergency Medicine | Admitting: Emergency Medicine

## 2022-09-16 ENCOUNTER — Other Ambulatory Visit: Payer: Self-pay

## 2022-09-16 ENCOUNTER — Encounter (HOSPITAL_COMMUNITY): Payer: Self-pay

## 2022-09-16 DIAGNOSIS — I10 Essential (primary) hypertension: Secondary | ICD-10-CM | POA: Insufficient documentation

## 2022-09-16 DIAGNOSIS — Z5321 Procedure and treatment not carried out due to patient leaving prior to being seen by health care provider: Secondary | ICD-10-CM | POA: Diagnosis not present

## 2022-09-16 DIAGNOSIS — R030 Elevated blood-pressure reading, without diagnosis of hypertension: Secondary | ICD-10-CM | POA: Diagnosis not present

## 2022-09-16 NOTE — ED Triage Notes (Signed)
Pov from home. Cc of elevated blood pressure. Took it at home and got 156/109. 139/80 in triage. Says she "felt a little weird" so she took it. Is on BP meds. Denies pain.

## 2022-10-21 ENCOUNTER — Other Ambulatory Visit: Payer: BC Managed Care – PPO | Admitting: Adult Health

## 2022-10-25 ENCOUNTER — Other Ambulatory Visit (HOSPITAL_COMMUNITY)
Admission: RE | Admit: 2022-10-25 | Discharge: 2022-10-25 | Disposition: A | Discharge status: Home / Self Care | Payer: BC Managed Care – PPO | Source: Ambulatory Visit | Attending: Adult Health | Admitting: Adult Health

## 2022-10-25 ENCOUNTER — Encounter: Payer: Self-pay | Admitting: Adult Health

## 2022-10-25 ENCOUNTER — Ambulatory Visit (INDEPENDENT_AMBULATORY_CARE_PROVIDER_SITE_OTHER): Payer: BC Managed Care – PPO | Admitting: Adult Health

## 2022-10-25 VITALS — BP 109/75 | HR 76 | Ht 63.0 in | Wt 159.0 lb

## 2022-10-25 DIAGNOSIS — Z01419 Encounter for gynecological examination (general) (routine) without abnormal findings: Secondary | ICD-10-CM

## 2022-10-25 DIAGNOSIS — Z1211 Encounter for screening for malignant neoplasm of colon: Secondary | ICD-10-CM | POA: Diagnosis not present

## 2022-10-25 DIAGNOSIS — Z4689 Encounter for fitting and adjustment of other specified devices: Secondary | ICD-10-CM

## 2022-10-25 DIAGNOSIS — N814 Uterovaginal prolapse, unspecified: Secondary | ICD-10-CM | POA: Diagnosis not present

## 2022-10-25 LAB — HEMOCCULT GUIAC POC 1CARD (OFFICE): Fecal Occult Blood, POC: NEGATIVE

## 2022-10-25 NOTE — Progress Notes (Signed)
Patient ID: Erica Fry, female   DOB: 10-17-1961, 61 y.o.   MRN: [id number] History of Present Illness: Erica Fry is a 61 year old white female, married, PM in for a well woman gyn exam and pap. And pessary maintenance.  PCP is Dr Erica Fry.    Current Medications, Allergies, Past Medical History, Past Surgical History, Family History and Social History were reviewed in [employer] record.     Review of Systems: Patient denies any headaches, hearing loss,  blurred vision, shortness of breath, chest pain, abdominal pain, problems with bowel movements, urination, or intercourse(not active). No joint pain or mood swings. +tired has new grandbaby Denies nay bleeding or discharge    Physical Exam:BP 109/75 (BP Location: Right Arm, Patient Position: Sitting, Cuff Size: Normal)   Pulse 76   Ht 5\' 3"  (1.6 m)   Wt 159 lb (72.1 kg)   BMI 28.17 kg/m   General:  Well developed, well nourished, no acute distress Skin:  Warm and dry Neck:  Midline trachea, normal thyroid, good ROM, no lymphadenopathy,no carotid bruits heard Lungs; Clear to auscultation bilaterally Breast:  No dominant palpable mass, retraction, or nipple discharge Cardiovascular: Regular rate and rhythm Abdomen:  Soft, non tender, no hepatosplenomegaly Pelvic:  External genitalia is normal in appearance, no lesions.  The vagina is pale.Pessary (#3 ring with support), easily removed, no lesions.+cystocele and prolapse. Urethra has small caruncle. The cervix is smooth, pap with HR HPV genotyping performed, pessary was cleaned with soap and water and dried and easily replaced.  Uterus is felt to be normal size, shape, and contour.  No adnexal masses or tenderness noted.Bladder is non tender, no masses felt. Rectal: Good sphincter tone, no polyps, + hemorrhoids felt.  Hemoccult negative. Extremities/musculoskeletal:  No swelling or varicosities noted, no clubbing or cyanosis Psych:  No mood changes, alert and  cooperative,seems happy AA is 0 Fall risk is low    10/25/2022   11:31 AM 10/17/2021    8:33 AM 09/13/2021    1:45 PM  Depression screen PHQ 2/9  Decreased Interest 0 0 0  Down, Depressed, Hopeless 0 0 0  PHQ - 2 Score 0 0 0  Altered sleeping 0 1   Tired, decreased energy 1 1   Change in appetite 1 1   Feeling bad or failure about yourself  0 0   Trouble concentrating 0 0   Moving slowly or fidgety/restless 0 0   Suicidal thoughts 0 0   PHQ-9 Score 2 3        10/25/2022   11:32 AM 10/17/2021    8:33 AM 10/16/2020   11:59 AM  GAD 7 : Generalized Anxiety Score  Nervous, Anxious, on Edge 1 1 0  Control/stop worrying 0 1 0  Worry too much - different things 1 0 1  Trouble relaxing 0 0 0  Restless 0 0 0  Easily annoyed or irritable 1 0 0  Afraid - awful might happen 0 1 0  Total GAD 7 Score 3 3 1       Upstream - 10/25/22 1132       Pregnancy Intention Screening   Does the patient want to become pregnant in the next year? No    Does the patient's partner want to become pregnant in the next year? No    Would the patient like to discuss contraceptive options today? No      Contraception Wrap Up   Current Method Female Sterilization    End Method  Female Sterilization    Contraception Counseling Provided No            Examination chaperoned by Tish RN  Impression and Plan: 1. Encounter for gynecological examination with Papanicolaou smear of cervix Pap sent Pap in 3 years if negative  Physical in 1 year Mammogram was 09/06/22 and negative  Colonoscopy per GI Labs with PCP  2. Encounter for screening fecal occult blood testing Hemoccult was negative  3. Cystocele with uterine prolapse   4. Pessary maintenance Return in 4 months for pessary maintenance

## 2022-10-30 LAB — CYTOLOGY - PAP
Comment: NEGATIVE
Diagnosis: HIGH — AB
High risk HPV: NEGATIVE

## 2022-10-31 ENCOUNTER — Telehealth: Payer: Self-pay | Admitting: Adult Health

## 2022-10-31 ENCOUNTER — Encounter: Payer: Self-pay | Admitting: Adult Health

## 2022-10-31 DIAGNOSIS — R87619 Unspecified abnormal cytological findings in specimens from cervix uteri: Secondary | ICD-10-CM | POA: Insufficient documentation

## 2022-10-31 NOTE — Telephone Encounter (Signed)
Pt aware of pap, will get colpo appt

## 2022-11-11 ENCOUNTER — Encounter: Payer: Self-pay | Admitting: Women's Health

## 2022-11-11 ENCOUNTER — Other Ambulatory Visit (HOSPITAL_COMMUNITY)
Admission: RE | Admit: 2022-11-11 | Discharge: 2022-11-11 | Disposition: A | Discharge status: Home / Self Care | Payer: BC Managed Care – PPO | Source: Ambulatory Visit | Attending: Women's Health | Admitting: Women's Health

## 2022-11-11 ENCOUNTER — Ambulatory Visit (INDEPENDENT_AMBULATORY_CARE_PROVIDER_SITE_OTHER): Payer: BC Managed Care – PPO | Admitting: Women's Health

## 2022-11-11 VITALS — BP 110/69 | HR 75 | Ht 63.0 in | Wt 162.4 lb

## 2022-11-11 DIAGNOSIS — N888 Other specified noninflammatory disorders of cervix uteri: Secondary | ICD-10-CM | POA: Diagnosis not present

## 2022-11-11 DIAGNOSIS — R87611 Atypical squamous cells cannot exclude high grade squamous intraepithelial lesion on cytologic smear of cervix (ASC-H): Secondary | ICD-10-CM | POA: Diagnosis not present

## 2022-11-11 DIAGNOSIS — N72 Inflammatory disease of cervix uteri: Secondary | ICD-10-CM | POA: Diagnosis not present

## 2022-11-11 DIAGNOSIS — N870 Mild cervical dysplasia: Secondary | ICD-10-CM | POA: Diagnosis not present

## 2022-11-11 NOTE — Addendum Note (Signed)
Addended by: Dereck Ligas on: 11/11/2022 03:22 PM   Modules accepted: Orders

## 2022-11-11 NOTE — Progress Notes (Signed)
   COLPOSCOPY PROCEDURE NOTE Patient name: Erica Fry MRN [id number]  Date of birth: 1961/04/23 Subjective Findings:   Erica Fry is a 61 y.o. G2P2 Caucasian female being seen today for a colposcopy. Indication: Abnormal pap on 10/25/22: ASC-H w/ HRHPV negative  Prior cytology:  Date Result Procedure  10/14/19 NILM w/ HRHPV negative None  2017 NILM w/ HRHPV negative None  2014 NILM w/ HRHPV negative None  2013 ASCUS w/ HRHPV positive: type not specified colpo  No LMP recorded. Patient has had an ablation. Contraception: post menopausal status. Menopausal: yes. Hysterectomy: no.   Smoker: no. Immunocompromised: no.   The risks and benefits were explained and informed consent was obtained, and written copy is in chart. Pertinent History Reviewed:   Reviewed past medical,surgical, social, obstetrical and family history.  Reviewed problem list, medications and allergies. Objective Findings & Procedure:   Vitals:   11/11/22 1430  BP: 110/69  Pulse: 75  Weight: 162 lb 6 oz (73.7 kg)  Height: 5\' 3"  (1.6 m)  Body mass index is 28.76 kg/m.  No results found for this or any previous visit (from the past 24 hour(s)).   Time out was performed.  Speculum placed in the vagina, cervix fully visualized. SCJ: not fully visualized. Cervix swabbed x 3 with acetic acid.  Acetowhitening present: Yes Cervix: large visible lesion(s) at 9 o'clock, acetowhite lesion(s) noted at 3 o'clock, and punctation noted at 10 & 3o'clock. Endocervical curettage performed, Cervical biopsies taken at 9 & 3 o'clock, and Hemostasis achieved with Monsel's solution. Vagina: vaginal colposcopy not performed Vulva: vulvar colposcopy not performed  Specimens: 3  Complications: none  Chaperone:    Colposcopic Impression & Plan:   Colposcopy findings consistent with HSIL Plan: Post biopsy instructions given, Will notify patient of results when back, and Will base plan of care on pathology  results and ASCCP guidelines  Return in about 1 year (around 11/12/2023) for Pap & physical.  11/14/2023 CNM, WHNP-BC 11/11/2022 3:12 PM

## 2022-11-11 NOTE — Patient Instructions (Signed)
Colposcopy, Care After  The following information offers guidance on how to care for yourself after your procedure. Your health care provider may also give you more specific instructions. If you have problems or questions, contact your health care provider. What can I expect after the procedure? If you had a colposcopy without a biopsy, you can expect to feel fine right away after your procedure. However, you may have some spotting of blood for a few days. You can return to your normal activities. If you had a colposcopy with a biopsy, it is common after the procedure to have: Soreness and mild pain. These may last for a few days. Mild vaginal bleeding or discharge that is dark-colored and grainy. This may last for a few days. The discharge may be caused by a liquid (solution) that was used during the procedure. You may need to wear a sanitary pad during this time. Spotting of blood for at least 48 hours after the procedure. Follow these instructions at home: Medicines Take over-the-counter and prescription medicines only as told by your health care provider. Talk with your health care provider about what type of over-the-counter pain medicines and prescription medicines you can start to take again. It is especially important to talk with your health care provider if you take blood thinners. Activity Avoid using douche products, using tampons, and having sex for at least 3 days after the procedure or for as long as told by your health care provider. Return to your normal activities as told by your health care provider. Ask your health care provider what activities are safe for you. General instructions Ask your health care provider if you may take baths, swim, or use a hot tub. You may take showers. If you use birth control (contraception), continue to use it. Keep all follow-up visits. This is important. Contact a health care provider if: You have a fever or chills. You faint or feel  light-headed. Get help right away if: You have heavy bleeding from your vagina or pass blood clots. Heavy bleeding is bleeding that soaks through a sanitary pad in less than 1 hour. You have vaginal discharge that is abnormal, is yellow in color, or smells bad. This could be a sign of infection. You have severe pain or cramps in your lower abdomen that do not go away with medicine. Summary If you had a colposcopy without a biopsy, you can expect to feel fine right away, but you may have some spotting of blood for a few days. You can return to your normal activities. If you had a colposcopy with a biopsy, it is common to have mild pain for a few days and spotting for 48 hours after the procedure. Avoid using douche products, using tampons, and having sex for at least 3 days after the procedure or for as long as told by your health care provider. Get help right away if you have heavy bleeding, severe pain, or signs of infection. This information is not intended to replace advice given to you by your health care provider. Make sure you discuss any questions you have with your health care provider. Document Revised: 04/29/2021 Document Reviewed: 04/29/2021 Elsevier Patient Education  2023 Elsevier Inc.  

## 2022-11-14 DIAGNOSIS — E039 Hypothyroidism, unspecified: Secondary | ICD-10-CM | POA: Diagnosis not present

## 2022-11-14 DIAGNOSIS — R7301 Impaired fasting glucose: Secondary | ICD-10-CM | POA: Diagnosis not present

## 2022-11-15 LAB — SURGICAL PATHOLOGY

## 2022-12-16 DIAGNOSIS — D049 Carcinoma in situ of skin, unspecified: Secondary | ICD-10-CM

## 2022-12-16 HISTORY — DX: Carcinoma in situ of skin, unspecified: D04.9

## 2022-12-16 HISTORY — PX: MOHS SURGERY: SUR867

## 2023-02-24 ENCOUNTER — Ambulatory Visit: Payer: BC Managed Care – PPO | Admitting: Adult Health

## 2023-02-28 DIAGNOSIS — E559 Vitamin D deficiency, unspecified: Secondary | ICD-10-CM | POA: Diagnosis not present

## 2023-02-28 DIAGNOSIS — R7301 Impaired fasting glucose: Secondary | ICD-10-CM | POA: Diagnosis not present

## 2023-02-28 DIAGNOSIS — E039 Hypothyroidism, unspecified: Secondary | ICD-10-CM | POA: Diagnosis not present

## 2023-02-28 DIAGNOSIS — I10 Essential (primary) hypertension: Secondary | ICD-10-CM | POA: Diagnosis not present

## 2023-02-28 DIAGNOSIS — E782 Mixed hyperlipidemia: Secondary | ICD-10-CM | POA: Diagnosis not present

## 2023-03-06 ENCOUNTER — Encounter: Payer: Self-pay | Admitting: Adult Health

## 2023-03-06 ENCOUNTER — Ambulatory Visit: Payer: BC Managed Care – PPO | Admitting: Adult Health

## 2023-03-06 VITALS — BP 122/86 | HR 75 | Ht 63.5 in | Wt 163.0 lb

## 2023-03-06 DIAGNOSIS — Z4689 Encounter for fitting and adjustment of other specified devices: Secondary | ICD-10-CM | POA: Diagnosis not present

## 2023-03-06 DIAGNOSIS — N814 Uterovaginal prolapse, unspecified: Secondary | ICD-10-CM

## 2023-03-06 NOTE — Progress Notes (Signed)
  Subjective:     Patient ID: Erica Fry, female   DOB: Dec 24, 1960, 62 y.o.   MRN: [id number]  HPI Erica Fry is a 62 year old white female, married, PM in for pessary maintenance.   Last pap was [facility], +HPV 10/1022, had colpo, repeat pap 10/2023  PCP is Dr Nevada Crane.  Review of Systems Denies any vaginal bleeding,discharge or odor Reviewed past medical,surgical, social and family history. Reviewed medications and allergies.     Objective:   Physical Exam BP 122/86 (BP Location: Left Arm, Patient Position: Sitting, Cuff Size: Normal)   Pulse 75   Ht 5' 3.5" (1.613 m)   Wt 163 lb (73.9 kg)   BMI 28.42 kg/m     Skin warm and dry.Pelvic:  External genitalia is normal in appearance, no lesions.  The vagina is pale pink with out any lesions noted,+cystocele, after pessary, #3 ring with support,easily removed, was washed with soap and water then dried and reinserted . Urethra has small caruncle. The cervix is smooth.  Uterus is felt to be normal size, shape, and contour.  No adnexal masses or tenderness noted.Bladder is non tender, no masses   Fall risk is low  Upstream - 03/06/23 1507       Pregnancy Intention Screening   Does the patient want to become pregnant in the next year? No    Does the patient's partner want to become pregnant in the next year? No    Would the patient like to discuss contraceptive options today? No      Contraception Wrap Up   Current Method Female Sterilization    End Method Female Sterilization    Contraception Counseling Provided No             Examination chaperoned by Levy Pupa LPN Assessment:     1. Pessary maintenance Pessary cleaned and reinserted   2. Cystocele with uterine prolapse     Plan:     Return in 4 months for pessary maintenance

## 2023-03-07 DIAGNOSIS — E039 Hypothyroidism, unspecified: Secondary | ICD-10-CM | POA: Diagnosis not present

## 2023-03-07 DIAGNOSIS — R7301 Impaired fasting glucose: Secondary | ICD-10-CM | POA: Diagnosis not present

## 2023-03-07 DIAGNOSIS — I10 Essential (primary) hypertension: Secondary | ICD-10-CM | POA: Diagnosis not present

## 2023-03-07 DIAGNOSIS — R944 Abnormal results of kidney function studies: Secondary | ICD-10-CM | POA: Diagnosis not present

## 2023-07-07 ENCOUNTER — Ambulatory Visit: Payer: BC Managed Care – PPO | Admitting: Adult Health

## 2023-07-10 ENCOUNTER — Ambulatory Visit: Payer: BC Managed Care – PPO | Admitting: Adult Health

## 2023-07-23 ENCOUNTER — Ambulatory Visit: Payer: BC Managed Care – PPO | Admitting: Adult Health

## 2023-07-23 ENCOUNTER — Encounter: Payer: Self-pay | Admitting: Adult Health

## 2023-07-23 VITALS — BP 120/79 | HR 74 | Ht 63.5 in | Wt 163.0 lb

## 2023-07-23 DIAGNOSIS — Z4689 Encounter for fitting and adjustment of other specified devices: Secondary | ICD-10-CM | POA: Diagnosis not present

## 2023-07-23 DIAGNOSIS — N814 Uterovaginal prolapse, unspecified: Secondary | ICD-10-CM

## 2023-07-23 NOTE — Progress Notes (Signed)
  Subjective:     Patient ID: Erica Fry, female   DOB: 1961-01-04, 62 y.o.   MRN: [id number]  HPI Erica Fry is a 62 year old white female, married, PM in for pessary maintenance.    Last pap was [facility], +HPV 10/1022, had colpo, repeat pap 10/2023   PCP is Dr Margo Aye.  Review of Systems Denies any vaginal bleeding,discharge or odor Reviewed past medical,surgical, social and family history. Reviewed medications and allergies.       Objective:   Physical Exam BP 120/79 (BP Location: Left Arm, Patient Position: Sitting, Cuff Size: Normal)   Pulse 74   Ht 5' 3.5" (1.613 m)   Wt 163 lb (73.9 kg)   BMI 28.42 kg/m     Skin warm and dry.Pelvic:  External genitalia is normal in appearance, no lesions.  The vagina is pale pink with out any lesions noted,+cystocele, after pessary, #3 ring with support,easily removed, was washed with soap and water then dried and reinserted . Urethra has small caruncle. The cervix is bulbous.  Uterus is felt to be normal size, shape, and contour.  No adnexal masses or tenderness noted.Bladder is non tender, no masses   Fall risk is low  Upstream - 07/23/23 0949       Pregnancy Intention Screening   Does the patient want to become pregnant in the next year? N/A    Does the patient's partner want to become pregnant in the next year? N/A    Would the patient like to discuss contraceptive options today? N/A      Contraception Wrap Up   Current Method Female Sterilization    End Method Female Sterilization    Contraception Counseling Provided No            Examination chaperoned by Malachy Mood LPN  Assessment:     1. Pessary maintenance Pessary cleaned and reinserted   2. Cystocele with uterine prolapse      Plan:     Return in 4 months for pessary maintenance pap and physical

## 2023-08-19 DIAGNOSIS — L570 Actinic keratosis: Secondary | ICD-10-CM | POA: Diagnosis not present

## 2023-08-19 DIAGNOSIS — C44529 Squamous cell carcinoma of skin of other part of trunk: Secondary | ICD-10-CM | POA: Diagnosis not present

## 2023-08-19 DIAGNOSIS — X32XXXD Exposure to sunlight, subsequent encounter: Secondary | ICD-10-CM | POA: Diagnosis not present

## 2023-08-26 ENCOUNTER — Other Ambulatory Visit (HOSPITAL_COMMUNITY): Payer: Self-pay | Admitting: Family Medicine

## 2023-08-26 DIAGNOSIS — Z122 Encounter for screening for malignant neoplasm of respiratory organs: Secondary | ICD-10-CM

## 2023-08-27 ENCOUNTER — Ambulatory Visit (HOSPITAL_COMMUNITY)
Admission: RE | Admit: 2023-08-27 | Discharge: 2023-08-27 | Disposition: A | Discharge status: Home / Self Care | Payer: BC Managed Care – PPO | Source: Ambulatory Visit | Attending: Family Medicine | Admitting: Family Medicine

## 2023-08-27 DIAGNOSIS — Z122 Encounter for screening for malignant neoplasm of respiratory organs: Secondary | ICD-10-CM | POA: Diagnosis not present

## 2023-09-08 DIAGNOSIS — I10 Essential (primary) hypertension: Secondary | ICD-10-CM | POA: Diagnosis not present

## 2023-09-08 DIAGNOSIS — E039 Hypothyroidism, unspecified: Secondary | ICD-10-CM | POA: Diagnosis not present

## 2023-09-08 DIAGNOSIS — R7301 Impaired fasting glucose: Secondary | ICD-10-CM | POA: Diagnosis not present

## 2023-09-08 DIAGNOSIS — E559 Vitamin D deficiency, unspecified: Secondary | ICD-10-CM | POA: Diagnosis not present

## 2023-09-08 DIAGNOSIS — E782 Mixed hyperlipidemia: Secondary | ICD-10-CM | POA: Diagnosis not present

## 2023-09-12 DIAGNOSIS — R809 Proteinuria, unspecified: Secondary | ICD-10-CM | POA: Diagnosis not present

## 2023-09-12 DIAGNOSIS — E039 Hypothyroidism, unspecified: Secondary | ICD-10-CM | POA: Diagnosis not present

## 2023-09-12 DIAGNOSIS — E782 Mixed hyperlipidemia: Secondary | ICD-10-CM | POA: Diagnosis not present

## 2023-09-12 DIAGNOSIS — I10 Essential (primary) hypertension: Secondary | ICD-10-CM | POA: Diagnosis not present

## 2023-09-12 DIAGNOSIS — R7301 Impaired fasting glucose: Secondary | ICD-10-CM | POA: Diagnosis not present

## 2023-09-23 DIAGNOSIS — C44529 Squamous cell carcinoma of skin of other part of trunk: Secondary | ICD-10-CM | POA: Diagnosis not present

## 2023-09-30 DIAGNOSIS — C44529 Squamous cell carcinoma of skin of other part of trunk: Secondary | ICD-10-CM | POA: Diagnosis not present

## 2023-11-21 ENCOUNTER — Other Ambulatory Visit (HOSPITAL_COMMUNITY)
Admission: RE | Admit: 2023-11-21 | Discharge: 2023-11-21 | Disposition: A | Discharge status: Home / Self Care | Payer: BC Managed Care – PPO | Source: Ambulatory Visit | Attending: Adult Health | Admitting: Adult Health

## 2023-11-21 ENCOUNTER — Ambulatory Visit: Payer: BC Managed Care – PPO | Admitting: Adult Health

## 2023-11-21 ENCOUNTER — Encounter: Payer: Self-pay | Admitting: Adult Health

## 2023-11-21 VITALS — BP 123/80 | HR 77 | Ht 64.0 in | Wt 169.5 lb

## 2023-11-21 DIAGNOSIS — R87611 Atypical squamous cells cannot exclude high grade squamous intraepithelial lesion on cytologic smear of cervix (ASC-H): Secondary | ICD-10-CM

## 2023-11-21 DIAGNOSIS — S00512A Abrasion of oral cavity, initial encounter: Secondary | ICD-10-CM

## 2023-11-21 DIAGNOSIS — Z4689 Encounter for fitting and adjustment of other specified devices: Secondary | ICD-10-CM | POA: Diagnosis not present

## 2023-11-21 DIAGNOSIS — Z01419 Encounter for gynecological examination (general) (routine) without abnormal findings: Secondary | ICD-10-CM | POA: Insufficient documentation

## 2023-11-21 DIAGNOSIS — Z1211 Encounter for screening for malignant neoplasm of colon: Secondary | ICD-10-CM

## 2023-11-21 DIAGNOSIS — Z1331 Encounter for screening for depression: Secondary | ICD-10-CM | POA: Diagnosis not present

## 2023-11-21 DIAGNOSIS — N814 Uterovaginal prolapse, unspecified: Secondary | ICD-10-CM

## 2023-11-21 LAB — HEMOCCULT GUIAC POC 1CARD (OFFICE): Fecal Occult Blood, POC: NEGATIVE

## 2023-11-21 MED ORDER — AMOXICILLIN 500 MG PO CAPS: 500.0000 mg | ORAL_CAPSULE | Freq: Three times a day (TID) | ORAL | 0 refills | Status: DC

## 2023-11-21 NOTE — Progress Notes (Signed)
Patient ID: Erica Fry, female   DOB: [birth year], 62 y.o.   MRN: [id number] History of Present Illness: Erica Fry is a 62 year old white female, married, PM in for well woman gyn exam and pap and pessary maintenance. She has receding gum on the bottom and grandchild's head hit her in the mouth, gum sore and ?pus.  Last pap was [facility], negative HPV, 10/25/22.  Colpo LSIL 11/11/22.  PCP is Dr Margo Aye.    Current Medications, Allergies, Past Medical History, Past Surgical History, Family History and Social History were reviewed in [name] record.     Review of Systems: Patient denies any headaches, hearing loss, fatigue, blurred vision, shortness of breath, chest pain, abdominal pain, problems with bowel movements, urination, or intercourse.(Not active) No joint pain or mood swings. Denies any vaginal bleeding or discharge.  See HPI for positives   Physical Exam:BP 123/80 (BP Location: Left Arm, Patient Position: Sitting, Cuff Size: Normal)   Pulse 77   Ht 5\' 4"  (1.626 m)   Wt 169 lb 8 oz (76.9 kg)   BMI 29.09 kg/m   General:  Well developed, well nourished, no acute distress Skin:  Warm and dry Neck:  Midline trachea, normal thyroid, good ROM, no lymphadenopathy, no carotid bruits heard. Has area lower gum that is red Lungs; Clear to auscultation bilaterally Breast:  No dominant palpable mass, retraction, or nipple discharge, scar left chest above breast had MOHS, for SCCIS of skin. Cardiovascular: Regular rate and rhythm Abdomen:  Soft, non tender, no hepatosplenomegaly Pelvic:  External genitalia is normal in appearance, no lesions.  The vagina is pale, pessary removed and washed with soap and water and reinserted after pap.+cystocele and prolapse. Urethra has caruncle. The cervix is smooth,,pap with HR HPV genotyping performed.Marland Kitchen  Uterus is felt to be normal size, shape, and contour.  No adnexal masses or tenderness noted.Bladder is non tender, no masses  felt. Rectal: Good sphincter tone, no polyps, or hemorrhoids felt.  Hemoccult negative. Extremities/musculoskeletal:  No swelling or varicosities noted, no clubbing or cyanosis Psych:  No mood changes, alert and cooperative,seems happy AA is 0 Fall risk is low    11/21/2023    9:39 AM 10/25/2022   11:31 AM 10/17/2021    8:33 AM  Depression screen PHQ 2/9  Decreased Interest 0 0 0  Down, Depressed, Hopeless 0 0 0  PHQ - 2 Score 0 0 0  Altered sleeping 0 0 1  Tired, decreased energy 0 1 1  Change in appetite 0 1 1  Feeling bad or failure about yourself  0 0 0  Trouble concentrating 1 0 0  Moving slowly or fidgety/restless 0 0 0  Suicidal thoughts 0 0 0  PHQ-9 Score 1 2 3        11/21/2023    9:39 AM 10/25/2022   11:32 AM 10/17/2021    8:33 AM 10/16/2020   11:59 AM  GAD 7 : Generalized Anxiety Score  Nervous, Anxious, on Edge 0 1 1 0  Control/stop worrying 0 0 1 0  Worry too much - different things 0 1 0 1  Trouble relaxing 0 0 0 0  Restless 1 0 0 0  Easily annoyed or irritable 1 1 0 0  Afraid - awful might happen 0 0 1 0  Total GAD 7 Score 2 3 3 1       Upstream - 11/21/23 0950       Pregnancy Intention Screening   Does the patient want  to become pregnant in the next year? N/A    Does the patient's partner want to become pregnant in the next year? N/A    Would the patient like to discuss contraceptive options today? N/A      Contraception Wrap Up   Current Method Abstinence;Female Sterilization   PM   End Method Abstinence;Female Sterilization    Contraception Counseling Provided No            Examination chaperoned by Malachy Mood LPN   Impression and plan: 1. Encounter for gynecological examination with Papanicolaou smear of cervix Pap sent Physical in 1 year Mammogram was negative 09/06/22 Colonoscopy per [facility] with PCP - Cytology - PAP( Sharon)  2. Encounter for screening fecal occult blood testing Hemoccult was negative   3. Pessary  maintenance Cleaned and reinserted Follow up in 4 months for pessary maintenance   4. Cystocele with uterine prolapse  5. Atypical squamous cells cannot exclude high grade squamous intraepithelial lesion on cytologic smear of cervix (ASC-H)   6. Abrasion of lower gingiva, initial encounter Will rx amoxil Meds ordered this encounter  Medications   amoxicillin (AMOXIL) 500 MG capsule    Sig: Take 1 capsule (500 mg total) by mouth 3 (three) times daily.    Dispense:  21 capsule    Refill:  0    Order Specific Question:   Supervising Provider    Answer:   Lazaro Arms [[id number]]   See dentist when available Can use warm salt water swish  Brush gentle

## 2023-11-26 LAB — CYTOLOGY - PAP
Comment: NEGATIVE
Diagnosis: NEGATIVE
High risk HPV: NEGATIVE

## 2024-03-04 DIAGNOSIS — E039 Hypothyroidism, unspecified: Secondary | ICD-10-CM | POA: Diagnosis not present

## 2024-03-04 DIAGNOSIS — E782 Mixed hyperlipidemia: Secondary | ICD-10-CM | POA: Diagnosis not present

## 2024-03-10 DIAGNOSIS — Z1283 Encounter for screening for malignant neoplasm of skin: Secondary | ICD-10-CM | POA: Diagnosis not present

## 2024-03-10 DIAGNOSIS — L570 Actinic keratosis: Secondary | ICD-10-CM | POA: Diagnosis not present

## 2024-03-10 DIAGNOSIS — Z08 Encounter for follow-up examination after completed treatment for malignant neoplasm: Secondary | ICD-10-CM | POA: Diagnosis not present

## 2024-03-10 DIAGNOSIS — B078 Other viral warts: Secondary | ICD-10-CM | POA: Diagnosis not present

## 2024-03-10 DIAGNOSIS — D485 Neoplasm of uncertain behavior of skin: Secondary | ICD-10-CM | POA: Diagnosis not present

## 2024-03-10 DIAGNOSIS — Z85828 Personal history of other malignant neoplasm of skin: Secondary | ICD-10-CM | POA: Diagnosis not present

## 2024-03-10 DIAGNOSIS — X32XXXD Exposure to sunlight, subsequent encounter: Secondary | ICD-10-CM | POA: Diagnosis not present

## 2024-03-10 DIAGNOSIS — D225 Melanocytic nevi of trunk: Secondary | ICD-10-CM | POA: Diagnosis not present

## 2024-03-11 ENCOUNTER — Other Ambulatory Visit (HOSPITAL_COMMUNITY): Payer: Self-pay | Admitting: Internal Medicine

## 2024-03-11 DIAGNOSIS — M810 Age-related osteoporosis without current pathological fracture: Secondary | ICD-10-CM

## 2024-03-11 DIAGNOSIS — E039 Hypothyroidism, unspecified: Secondary | ICD-10-CM | POA: Diagnosis not present

## 2024-03-11 DIAGNOSIS — Z1231 Encounter for screening mammogram for malignant neoplasm of breast: Secondary | ICD-10-CM

## 2024-03-11 DIAGNOSIS — R7301 Impaired fasting glucose: Secondary | ICD-10-CM | POA: Diagnosis not present

## 2024-03-11 DIAGNOSIS — E559 Vitamin D deficiency, unspecified: Secondary | ICD-10-CM | POA: Diagnosis not present

## 2024-03-11 DIAGNOSIS — E782 Mixed hyperlipidemia: Secondary | ICD-10-CM | POA: Diagnosis not present

## 2024-03-11 DIAGNOSIS — I10 Essential (primary) hypertension: Secondary | ICD-10-CM | POA: Diagnosis not present

## 2024-03-11 DIAGNOSIS — Z0001 Encounter for general adult medical examination with abnormal findings: Secondary | ICD-10-CM | POA: Diagnosis not present

## 2024-03-31 DIAGNOSIS — L988 Other specified disorders of the skin and subcutaneous tissue: Secondary | ICD-10-CM | POA: Diagnosis not present

## 2024-03-31 DIAGNOSIS — D485 Neoplasm of uncertain behavior of skin: Secondary | ICD-10-CM | POA: Diagnosis not present

## 2024-04-01 ENCOUNTER — Ambulatory Visit (HOSPITAL_COMMUNITY)
Admission: RE | Admit: 2024-04-01 | Discharge: 2024-04-01 | Disposition: A | Discharge status: Home / Self Care | Source: Ambulatory Visit | Attending: Internal Medicine | Admitting: Internal Medicine

## 2024-04-01 ENCOUNTER — Encounter (HOSPITAL_COMMUNITY): Payer: Self-pay

## 2024-04-01 DIAGNOSIS — Z780 Asymptomatic menopausal state: Secondary | ICD-10-CM | POA: Diagnosis not present

## 2024-04-01 DIAGNOSIS — M8588 Other specified disorders of bone density and structure, other site: Secondary | ICD-10-CM | POA: Diagnosis not present

## 2024-04-01 DIAGNOSIS — M810 Age-related osteoporosis without current pathological fracture: Secondary | ICD-10-CM | POA: Diagnosis not present

## 2024-04-01 DIAGNOSIS — Z1231 Encounter for screening mammogram for malignant neoplasm of breast: Secondary | ICD-10-CM | POA: Diagnosis not present

## 2024-04-15 DIAGNOSIS — R7301 Impaired fasting glucose: Secondary | ICD-10-CM | POA: Diagnosis not present

## 2024-04-15 DIAGNOSIS — E039 Hypothyroidism, unspecified: Secondary | ICD-10-CM | POA: Diagnosis not present

## 2024-04-15 DIAGNOSIS — E559 Vitamin D deficiency, unspecified: Secondary | ICD-10-CM | POA: Diagnosis not present

## 2024-04-20 DIAGNOSIS — R7301 Impaired fasting glucose: Secondary | ICD-10-CM | POA: Diagnosis not present

## 2024-04-20 DIAGNOSIS — E039 Hypothyroidism, unspecified: Secondary | ICD-10-CM | POA: Diagnosis not present

## 2024-04-20 DIAGNOSIS — M8580 Other specified disorders of bone density and structure, unspecified site: Secondary | ICD-10-CM | POA: Diagnosis not present

## 2024-04-20 DIAGNOSIS — E559 Vitamin D deficiency, unspecified: Secondary | ICD-10-CM | POA: Diagnosis not present

## 2024-04-20 DIAGNOSIS — I10 Essential (primary) hypertension: Secondary | ICD-10-CM | POA: Diagnosis not present

## 2024-05-04 ENCOUNTER — Encounter: Payer: Self-pay | Admitting: Adult Health

## 2024-05-04 ENCOUNTER — Ambulatory Visit: Admitting: Adult Health

## 2024-05-04 VITALS — BP 129/80 | HR 77 | Ht 64.0 in | Wt 171.0 lb

## 2024-05-04 DIAGNOSIS — N814 Uterovaginal prolapse, unspecified: Secondary | ICD-10-CM

## 2024-05-04 DIAGNOSIS — Z4689 Encounter for fitting and adjustment of other specified devices: Secondary | ICD-10-CM | POA: Diagnosis not present

## 2024-05-04 NOTE — Progress Notes (Signed)
  Subjective:     Patient ID: Erica Fry, female   DOB: [birth year], 63 y.o.   MRN: [id number]  HPI Erica Fry is a 63 year old white female, married, PM in for pessary maintenance.     Component Value Date/Time   DIAGPAP  11/21/2023 0951    - Negative for intraepithelial lesion or malignancy (NILM)   DIAGPAP (A) 10/25/2022 1132    - Atypical squamous cells, cannot exclude high grade squamous   DIAGPAP intraepithelial lesion (ASC-H) (A) 10/25/2022 1132   HPVHIGH Negative 11/21/2023 0951   HPVHIGH Negative 10/25/2022 1132   HPVHIGH Negative 10/14/2019 1415   ADEQPAP  11/21/2023 0951    Satisfactory for evaluation. The presence or absence of an   ADEQPAP  11/21/2023 0951    endocervical/transformation zone component cannot be determined because   ADEQPAP of atrophy. 11/21/2023 0951    PCP is Dr Del Favia  Review of Systems For pessary maintenance Occasional odor Denies any vaginal bleeding  Reviewed past medical,surgical, social and family history. Reviewed medications and allergies.     Objective:   Physical Exam BP 129/80 (BP Location: Left Arm, Patient Position: Sitting, Cuff Size: Normal)   Pulse 77   Ht 5\' 4"  (1.626 m)   Wt 171 lb (77.6 kg)   BMI 29.35 kg/m      Skin warm and dry.Pelvic:  External genitalia is normal in appearance, no lesions.  The vagina is pale pink with out any lesions noted,+cystocele, pessary, #3 ring with support,easily removed, was washed with soap and water  then dried and reinserted . Examination chaperoned by Wendell Halt RN   Assessment:     1. Pessary maintenance (Primary) Cleaned and reinserted   2. Cystocele with uterine prolapse     Plan:     Return in 4 months for pessary maintenance

## 2024-06-30 DIAGNOSIS — Z1283 Encounter for screening for malignant neoplasm of skin: Secondary | ICD-10-CM | POA: Diagnosis not present

## 2024-06-30 DIAGNOSIS — D225 Melanocytic nevi of trunk: Secondary | ICD-10-CM | POA: Diagnosis not present

## 2024-09-03 ENCOUNTER — Encounter: Payer: Self-pay | Admitting: Adult Health

## 2024-09-03 ENCOUNTER — Ambulatory Visit: Admitting: Adult Health

## 2024-09-03 VITALS — BP 115/77 | HR 77 | Ht 64.0 in | Wt 167.0 lb

## 2024-09-03 DIAGNOSIS — N8110 Cystocele, unspecified: Secondary | ICD-10-CM | POA: Diagnosis not present

## 2024-09-03 DIAGNOSIS — Z960 Presence of urogenital implants: Secondary | ICD-10-CM | POA: Diagnosis not present

## 2024-09-03 DIAGNOSIS — Z4689 Encounter for fitting and adjustment of other specified devices: Secondary | ICD-10-CM

## 2024-09-03 DIAGNOSIS — N814 Uterovaginal prolapse, unspecified: Secondary | ICD-10-CM

## 2024-09-03 NOTE — Progress Notes (Signed)
  Subjective:     Patient ID: Erica Fry, female   DOB: 1961/08/17, 63 y.o.   MRN: [id number]  HPI Erica Fry is a 63 year old white female, married,PM in for pessary maintenance.     Component Value Date/Time   DIAGPAP  11/21/2023 0951    - Negative for intraepithelial lesion or malignancy (NILM)   DIAGPAP (A) 10/25/2022 1132    - Atypical squamous cells, cannot exclude high grade squamous   DIAGPAP intraepithelial lesion (ASC-H) (A) 10/25/2022 1132   HPVHIGH Negative 11/21/2023 0951   HPVHIGH Negative 10/25/2022 1132   HPVHIGH Negative 10/14/2019 1415   ADEQPAP  11/21/2023 0951    Satisfactory for evaluation. The presence or absence of an   ADEQPAP  11/21/2023 0951    endocervical/transformation zone component cannot be determined because   ADEQPAP of atrophy. 11/21/2023 0951    PCP is Dr Shona  Review of Systems For pessary maintenance Denies any vaginal bleeding or discharge Reviewed past medical,surgical, social and family history. Reviewed medications and allergies.     Objective:   Physical Exam BP 115/77 (BP Location: Right Arm, Patient Position: Sitting, Cuff Size: Normal)   Pulse 77   Ht 5' 4 (1.626 m)   Wt 167 lb (75.8 kg)   BMI 28.67 kg/m     Skin warm and dry.Pelvic:  External genitalia is normal in appearance, no lesions.  The vagina is pale pink with out any lesions noted,+cystocele, pessary, #3 ring with support,easily removed, was washed with soap and water  then dried and reinserted . Examination chaperoned by Aleck Blase RN    Assessment:     1. Pessary maintenance (Primary) Cleaned and reinserted   2. Cystocele with uterine prolapse     Plan:     Follow up in 4 months for physical and pessary maintenance

## 2024-09-08 DIAGNOSIS — E039 Hypothyroidism, unspecified: Secondary | ICD-10-CM | POA: Diagnosis not present

## 2024-09-08 DIAGNOSIS — R7301 Impaired fasting glucose: Secondary | ICD-10-CM | POA: Diagnosis not present

## 2024-09-08 DIAGNOSIS — E782 Mixed hyperlipidemia: Secondary | ICD-10-CM | POA: Diagnosis not present

## 2024-09-08 DIAGNOSIS — E559 Vitamin D deficiency, unspecified: Secondary | ICD-10-CM | POA: Diagnosis not present

## 2024-09-14 DIAGNOSIS — E039 Hypothyroidism, unspecified: Secondary | ICD-10-CM | POA: Diagnosis not present

## 2024-09-14 DIAGNOSIS — I10 Essential (primary) hypertension: Secondary | ICD-10-CM | POA: Diagnosis not present

## 2024-09-14 DIAGNOSIS — E782 Mixed hyperlipidemia: Secondary | ICD-10-CM | POA: Diagnosis not present

## 2024-09-14 DIAGNOSIS — M8580 Other specified disorders of bone density and structure, unspecified site: Secondary | ICD-10-CM | POA: Diagnosis not present

## 2024-12-29 ENCOUNTER — Ambulatory Visit: Admitting: Adult Health

## 2024-12-29 ENCOUNTER — Encounter: Payer: Self-pay | Admitting: Adult Health

## 2024-12-29 VITALS — BP 124/83 | HR 77 | Ht 64.0 in | Wt 164.0 lb

## 2024-12-29 DIAGNOSIS — Z0000 Encounter for general adult medical examination without abnormal findings: Secondary | ICD-10-CM | POA: Insufficient documentation

## 2024-12-29 DIAGNOSIS — N814 Uterovaginal prolapse, unspecified: Secondary | ICD-10-CM | POA: Diagnosis not present

## 2024-12-29 DIAGNOSIS — N6459 Other signs and symptoms in breast: Secondary | ICD-10-CM

## 2024-12-29 DIAGNOSIS — Z4689 Encounter for fitting and adjustment of other specified devices: Secondary | ICD-10-CM | POA: Diagnosis not present

## 2024-12-29 NOTE — Progress Notes (Signed)
" °  Subjective:     Patient ID: Erica Fry, female   DOB: 1961-12-16, 64 y.o.   MRN: [id number]  HPI Joseph is a 64 year old white female, married, PM in for pessary maintenance, and says she feels vibration in left breat on and off, since before Christmas.    Component Value Date/Time   DIAGPAP  11/21/2023 0951    - Negative for intraepithelial lesion or malignancy (NILM)   DIAGPAP (A) 10/25/2022 1132    - Atypical squamous cells, cannot exclude high grade squamous   DIAGPAP intraepithelial lesion (ASC-H) (A) 10/25/2022 1132   HPVHIGH Negative 11/21/2023 0951   HPVHIGH Negative 10/25/2022 1132   HPVHIGH Negative 10/14/2019 1415   ADEQPAP  11/21/2023 0951    Satisfactory for evaluation. The presence or absence of an   ADEQPAP  11/21/2023 0951    endocervical/transformation zone component cannot be determined because   ADEQPAP of atrophy. 11/21/2023 0951   PCP is Shona   Review of Systems Denies any bleeding or discharge Has vibration left breat on and off, since before Chrristmas Reviewed past medical,surgical, social and family history. Reviewed medications and allergies.     Objective:   Physical Exam BP 124/83 (BP Location: Right Arm, Patient Position: Sitting, Cuff Size: Normal)   Pulse 77   Ht 5' 4 (1.626 m)   Wt 164 lb (74.4 kg)   BMI 28.15 kg/m      Skin warm and dry,  Breasts:no dominate palpable mass, retraction or nipple discharge  Pelvic: external genitalia is normal in appearance no lesions, vagina:#3 ring pessary with support removed, slight creamy discharge no odor, no irritation, pessary washed with soap and water  and easily replaced ,urethra has caruncle Fall risk Is low  Upstream - 12/29/24 1020       Pregnancy Intention Screening   Does the patient want to become pregnant in the next year? N/A    Does the patient's partner want to become pregnant in the next year? N/A    Would the patient like to discuss contraceptive options today? N/A       Contraception Wrap Up   Current Method Female Sterilization;Post-Menopause    End Method Female Sterilization;Post-Menopause    Contraception Counseling Provided No         Examination chaperoned by Clarita Salt LPN Assessment:     1. Pessary maintenance (Primary) Cleaned and reinserted  2. Cystocele with uterine prolapse  3. Normal breast exam No masses felt, could be nerve related Get mammogram in April     Plan:     Return in 4 months for pessary maintenance or sooner if needed     "

## 2025-04-28 ENCOUNTER — Ambulatory Visit: Admitting: Adult Health
